# Patient Record
Sex: Female | Born: 1975 | Hispanic: Yes | Marital: Married | Smoking: Never smoker
Health system: Southern US, Community
[De-identification: ages and names within clinical notes are randomized; demographics above are authoritative.]

## PROBLEM LIST (undated history)

## (undated) DIAGNOSIS — I10 Essential (primary) hypertension: Secondary | ICD-10-CM

## (undated) DIAGNOSIS — M1990 Unspecified osteoarthritis, unspecified site: Secondary | ICD-10-CM

## (undated) HISTORY — DX: Essential (primary) hypertension: I10

## (undated) HISTORY — PX: BACK SURGERY: SHX140

## (undated) HISTORY — PX: GALLBLADDER SURGERY: SHX652

## (undated) HISTORY — DX: Unspecified osteoarthritis, unspecified site: M19.90

---

## 2013-09-04 DIAGNOSIS — D259 Leiomyoma of uterus, unspecified: Secondary | ICD-10-CM | POA: Insufficient documentation

## 2013-09-04 DIAGNOSIS — N915 Oligomenorrhea, unspecified: Secondary | ICD-10-CM | POA: Insufficient documentation

## 2013-10-10 DIAGNOSIS — N840 Polyp of corpus uteri: Secondary | ICD-10-CM | POA: Insufficient documentation

## 2013-10-26 DIAGNOSIS — I10 Essential (primary) hypertension: Secondary | ICD-10-CM | POA: Insufficient documentation

## 2014-01-14 DIAGNOSIS — K8020 Calculus of gallbladder without cholecystitis without obstruction: Secondary | ICD-10-CM | POA: Insufficient documentation

## 2014-01-14 DIAGNOSIS — K810 Acute cholecystitis: Secondary | ICD-10-CM | POA: Insufficient documentation

## 2014-01-15 DIAGNOSIS — N83201 Unspecified ovarian cyst, right side: Secondary | ICD-10-CM | POA: Insufficient documentation

## 2014-01-17 DIAGNOSIS — R748 Abnormal levels of other serum enzymes: Secondary | ICD-10-CM | POA: Insufficient documentation

## 2014-04-12 HISTORY — PX: CHOLECYSTECTOMY: SHX55

## 2014-11-08 DIAGNOSIS — E669 Obesity, unspecified: Secondary | ICD-10-CM | POA: Insufficient documentation

## 2015-02-05 DIAGNOSIS — R569 Unspecified convulsions: Secondary | ICD-10-CM | POA: Insufficient documentation

## 2015-02-06 DIAGNOSIS — I455 Other specified heart block: Secondary | ICD-10-CM | POA: Insufficient documentation

## 2015-02-06 DIAGNOSIS — E236 Other disorders of pituitary gland: Secondary | ICD-10-CM | POA: Insufficient documentation

## 2015-04-13 HISTORY — PX: BACK SURGERY: SHX140

## 2016-06-05 DIAGNOSIS — M5126 Other intervertebral disc displacement, lumbar region: Secondary | ICD-10-CM | POA: Insufficient documentation

## 2017-08-03 DIAGNOSIS — M79602 Pain in left arm: Secondary | ICD-10-CM | POA: Insufficient documentation

## 2020-06-03 ENCOUNTER — Other Ambulatory Visit: Payer: Self-pay | Admitting: Nurse Practitioner

## 2020-06-03 DIAGNOSIS — I10 Essential (primary) hypertension: Secondary | ICD-10-CM

## 2020-06-03 DIAGNOSIS — R202 Paresthesia of skin: Secondary | ICD-10-CM

## 2020-06-03 DIAGNOSIS — R519 Headache, unspecified: Secondary | ICD-10-CM

## 2020-06-08 ENCOUNTER — Ambulatory Visit (INDEPENDENT_AMBULATORY_CARE_PROVIDER_SITE_OTHER): Payer: 59

## 2020-06-08 ENCOUNTER — Other Ambulatory Visit: Payer: Self-pay

## 2020-06-08 DIAGNOSIS — R519 Headache, unspecified: Secondary | ICD-10-CM | POA: Diagnosis not present

## 2020-06-08 DIAGNOSIS — R202 Paresthesia of skin: Secondary | ICD-10-CM

## 2020-06-08 DIAGNOSIS — I10 Essential (primary) hypertension: Secondary | ICD-10-CM | POA: Diagnosis not present

## 2020-06-19 NOTE — Progress Notes (Addendum)
NEUROLOGY CONSULTATION NOTE  Natasha Figueroa MRN: [id number] DOB: 10-04-1975  Referring provider: Glean Salvo, FNP Primary care provider: Glean Salvo, FNP  Reason for consult:  Headache, facial tingling  Assessment/Plan:   1.  Recurrent transient spells - unclear etiology.  Symptoms due not correspond to the stroke.  Consider migraine vs partial seizure 2.  Punctate left basal ganglia infarct - incidental finding - likely due to small vessel disease secondary to hypertension 3.  Hypertension  1.  Stroke workup: - CTA head and neck - lipid panel - echo 2.  Check routine EEG.  If unremarkable, will check 48 hour ambulatory EEG (will likely capture habitual spell as they occur daily) 3.  Recommend starting ASA 81mg  daily.  Husband reports prior reaction to ASA - "it made her crazy".  Her chart states tachycardia.  However it was for higher doses.  She will try 81mg  daily. 4.  Follow up with PCP to optomize blood pressure control 5.  Follow up after testing.   Subjective:  Natasha Figueroa is a 45 year old right-handed female with HTN and pituitary tumor who presents for headache and facial tingling.  History supplemented by primary care note.  She is also accompanied by her husband who provides history.  She started having headaches around 2016.  MRI of brain at that time showed a pituitary tumor.  She was going to undergo surgery but it was canceled because the mass shrank.  Headaches subsequently improved.   In late January, she began experiencing recurrent episodes.  She describes sensation of ants crawling on the right side of her face.  She feels like she is moving in slow motion.  She also has a metallic taste in her mouth.  Sometimes she loses complete vision for a couple of seconds.  No associated headache, dizziness, nausea, photophobia or unilateral numbness or weakness of her body.  They would last 30 minutes and occur every 3 to 5 hours.  She has an MRI of the brain without  contrast on 06/09/2020 which was personally reviewed and showed a punctate acute to subacute infarct in the left basal ganglia.  Minimal nonspecific cerebral white matter foci and empty sella also noted.  She has longstanding history of hypertension which has been difficult to control.    PAST MEDICAL HISTORY: Hypertension  PAST SURGICAL HISTORY: Past Surgical History:  Procedure Laterality Date  . BACK SURGERY    . GALLBLADDER SURGERY     MEDICATIONS: No current outpatient medications on file prior to visit.   No current facility-administered medications on file prior to visit.    ALLERGIES: Allergies  Allergen Reactions  . Aspirin Other (See Comments) and Palpitations    tachycardia tachycardia   . Tramadol Other (See Comments) and Rash    Rash all over body. Rash all over body.    FAMILY HISTORY: History reviewed. No pertinent family history.  Objective:  Blood pressure (!) 176/93, pulse 61, height 5\' 8"  (1.727 m), weight 213 lb 12.8 oz (97 kg), SpO2 100 %. General: No acute distress.  Patient appears well-groomed.   Head:  Normocephalic/atraumatic Eyes:  fundi examined but not visualized Neck: supple, no paraspinal tenderness, full range of motion Back: No paraspinal tenderness Heart: regular rate and rhythm Lungs: Clear to auscultation bilaterally. Vascular: No carotid bruits. Neurological Exam: Mental status: alert and oriented to person, place, and time, recent and remote memory intact, fund of knowledge intact, attention and concentration intact, speech fluent and not dysarthric, language intact. Cranial nerves:  CN I: not tested CN II: pupils equal, round and reactive to light, visual fields intact CN III, IV, VI:  full range of motion, no nystagmus, no ptosis CN V: facial sensation intact. CN VII: upper and lower face symmetric CN VIII: hearing intact CN IX, X: gag intact, uvula midline CN XI: sternocleidomastoid and trapezius muscles intact CN XII:  tongue midline Bulk & Tone: normal, no fasciculations. Motor:  muscle strength 5/5 throughout Sensation:  Pinprick, temperature and vibratory sensation intact. Deep Tendon Reflexes:  2+ throughout,  toes downgoing.   Finger to nose testing:  Without dysmetria.   Heel to shin:  Without dysmetria.   Gait:  Normal station and stride.  Romberg negative.    Thank you for allowing me to take part in the care of this patient.  Metta Clines, DO

## 2020-06-20 ENCOUNTER — Other Ambulatory Visit (INDEPENDENT_AMBULATORY_CARE_PROVIDER_SITE_OTHER): Payer: 59

## 2020-06-20 ENCOUNTER — Ambulatory Visit: Payer: 59 | Admitting: Neurology

## 2020-06-20 ENCOUNTER — Other Ambulatory Visit: Payer: Self-pay

## 2020-06-20 ENCOUNTER — Encounter: Payer: Self-pay | Admitting: Neurology

## 2020-06-20 VITALS — BP 176/93 | HR 61 | Ht 68.0 in | Wt 213.8 lb

## 2020-06-20 DIAGNOSIS — I10 Essential (primary) hypertension: Secondary | ICD-10-CM

## 2020-06-20 DIAGNOSIS — I6381 Other cerebral infarction due to occlusion or stenosis of small artery: Secondary | ICD-10-CM

## 2020-06-20 DIAGNOSIS — R29818 Other symptoms and signs involving the nervous system: Secondary | ICD-10-CM

## 2020-06-20 LAB — LIPID PANEL
Cholesterol: 200 mg/dL (ref 0–200)
HDL: 69.3 mg/dL (ref >39.00)
LDL Cholesterol: 112 mg/dL — ABNORMAL HIGH (ref 0–99)
NonHDL: 130.86
Total CHOL/HDL Ratio: 3
Triglycerides: 93 mg/dL (ref 0.0–149.0)
VLDL: 18.6 mg/dL (ref 0.0–40.0)

## 2020-06-20 NOTE — Patient Instructions (Addendum)
1.  Start aspirin 81mg  daily 2.  Check routine EEG.  If unremarkable will check 48 hour ambulatory EEG 3.  Check CTA of head and neck 4.  Check lipid panel 5.  Echocardiogram 6.  Follow up after testing.

## 2020-06-23 ENCOUNTER — Other Ambulatory Visit: Payer: Self-pay

## 2020-06-23 ENCOUNTER — Ambulatory Visit (INDEPENDENT_AMBULATORY_CARE_PROVIDER_SITE_OTHER): Payer: 59 | Admitting: Obstetrics & Gynecology

## 2020-06-23 ENCOUNTER — Other Ambulatory Visit (HOSPITAL_COMMUNITY)
Admission: RE | Admit: 2020-06-23 | Discharge: 2020-06-23 | Disposition: A | Discharge status: Home / Self Care | Payer: 59 | Source: Ambulatory Visit | Attending: Obstetrics & Gynecology | Admitting: Obstetrics & Gynecology

## 2020-06-23 ENCOUNTER — Telehealth: Payer: Self-pay | Admitting: Neurology

## 2020-06-23 ENCOUNTER — Encounter: Payer: Self-pay | Admitting: Obstetrics & Gynecology

## 2020-06-23 VITALS — BP 135/90 | HR 55 | Ht 68.0 in | Wt 215.0 lb

## 2020-06-23 DIAGNOSIS — Z1231 Encounter for screening mammogram for malignant neoplasm of breast: Secondary | ICD-10-CM | POA: Diagnosis not present

## 2020-06-23 DIAGNOSIS — Z01419 Encounter for gynecological examination (general) (routine) without abnormal findings: Secondary | ICD-10-CM | POA: Insufficient documentation

## 2020-06-23 DIAGNOSIS — E230 Hypopituitarism: Secondary | ICD-10-CM

## 2020-06-23 NOTE — Progress Notes (Signed)
Subjective:     Natasha Figueroa is a 45 y.o. female here for a routine exam. LMP 2018 Current complaints: Pt is amenorrheic. She was dx'd with a pituitary adenoma in 2018 after noting galactorrhea and AUB. Since that dx'd she has not had menses and the galactorrhea resolved. She is followed by neurology. She would like to have another baby but is not sure since she is amenorrheic. Her last MRI was 05/2020  Gynecologic History No LMP recorded. (Menstrual status: Other). Contraception: none Last Pap: >5 years prev. Results were: normal Last mammogram: age 55 years.   Obstetric History OB History  Gravida Para Term Preterm AB Living  3 3 3     3   SAB IAB Ectopic Multiple Live Births          3    # Outcome Date GA Lbr Len/2nd Weight Sex Delivery Anes PTL Lv  3 Term 2001 [redacted]w[redacted]d   F Vag-Spont None N LIV  2 Term 1999 [redacted]w[redacted]d   F Vag-Spont None N LIV  1 Term 100 [redacted]w[redacted]d   F Vag-Spont None N LIV   The following portions of the patient's history were reviewed and updated as appropriate: allergies, current medications, past family history, past medical history, past social history, past surgical history and problem list.  Review of Systems Pertinent items are noted in HPI.    Objective:  BP 135/90   Pulse (!) 55   Ht 5\' 8"  (1.727 m)   Wt 215 lb (97.5 kg)   BMI 32.69 kg/m  General Appearance:    Alert, cooperative, no distress, appears stated age  Head:    Normocephalic, without obvious abnormality, atraumatic  Eyes:    conjunctiva/corneas clear, EOM's intact, both eyes  Ears:    Normal external ear canals, both ears  Nose:   Nares normal, septum midline, mucosa normal, no drainage    or sinus tenderness  Throat:   Lips, mucosa, and tongue normal; teeth and gums normal  Neck:   Supple, symmetrical, trachea midline, no adenopathy;    thyroid:  no enlargement/tenderness/nodules  Back:     Symmetric, no curvature, ROM normal, no CVA tenderness  Lungs:     respirations unlabored  Chest Wall:    No  tenderness or deformity   Heart:    Regular rate and rhythm  Breast Exam:    No tenderness, masses, or nipple abnormality  Abdomen:     Soft, non-tender, bowel sounds active all four quadrants,    no masses, no organomegaly  Genitalia:    Normal female without lesion, discharge or tenderness     Extremities:   Extremities normal, atraumatic, no cyanosis or edema  Pulses:   2+ and symmetric all extremities  Skin:   Skin color, texture, turgor normal, no rashes or lesions      Assessment:    Healthy female exam.   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 Discussed with pt need for referral to REI if she is interested in actively pursuing pregnancy.    Plan:  Shiva was seen today for annual exam.  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 Harraway-Smith, M.D., Cherlynn June

## 2020-06-23 NOTE — Progress Notes (Signed)
Cap interpreter Retta Mac. Alucard Fearnow l Elektra Wartman, CMA

## 2020-06-23 NOTE — Telephone Encounter (Signed)
Patient's spouse called and said they will need the testing orders sent to another facility that is in-network with her insurance. He is not sure where that may be.

## 2020-06-24 ENCOUNTER — Telehealth: Payer: Self-pay | Admitting: General Practice

## 2020-06-24 LAB — CYTOLOGY - PAP
Comment: NEGATIVE
Diagnosis: NEGATIVE
High risk HPV: NEGATIVE

## 2020-06-24 NOTE — Telephone Encounter (Signed)
I have faxed all the PA forms and clinicals for the CTA head and neck @ [facility] and then the Echo Bubble study @ hospital. Pending determination.

## 2020-06-24 NOTE — Telephone Encounter (Signed)
Left message on VM informing patient of Mammogram appt scheduled on 07/08/2020 at 9:20am.

## 2020-07-02 ENCOUNTER — Other Ambulatory Visit: Payer: Self-pay

## 2020-07-02 DIAGNOSIS — R29818 Other symptoms and signs involving the nervous system: Secondary | ICD-10-CM

## 2020-07-02 DIAGNOSIS — I10 Essential (primary) hypertension: Secondary | ICD-10-CM

## 2020-07-02 DIAGNOSIS — I6381 Other cerebral infarction due to occlusion or stenosis of small artery: Secondary | ICD-10-CM

## 2020-07-02 NOTE — Telephone Encounter (Signed)
After calling her insurance company- the patient can only have the CTA of neck and head and the ECHO done at [address]. This is the address for [facility] @ [address] They have imaging there as well as I have already called the office to make sure they can complete all the necessary testing. Please send all 3 orders to this location as it is the only [facility] location where patient can have it done through insurance.   I have already received the approval for the CTAs Auth #: [id number] valid until 09/27/20. They are still working on the ECHO approval. But I will update this once we receive it.

## 2020-07-07 ENCOUNTER — Other Ambulatory Visit: Payer: 59

## 2020-07-08 ENCOUNTER — Other Ambulatory Visit: Payer: Self-pay

## 2020-07-08 ENCOUNTER — Ambulatory Visit (HOSPITAL_BASED_OUTPATIENT_CLINIC_OR_DEPARTMENT_OTHER)
Admission: RE | Admit: 2020-07-08 | Discharge: 2020-07-08 | Disposition: A | Discharge status: Home / Self Care | Payer: 59 | Source: Ambulatory Visit | Attending: Obstetrics & Gynecology | Admitting: Obstetrics & Gynecology

## 2020-07-08 ENCOUNTER — Telehealth: Payer: Self-pay | Admitting: Neurology

## 2020-07-08 ENCOUNTER — Encounter (HOSPITAL_BASED_OUTPATIENT_CLINIC_OR_DEPARTMENT_OTHER): Payer: Self-pay

## 2020-07-08 DIAGNOSIS — Z1231 Encounter for screening mammogram for malignant neoplasm of breast: Secondary | ICD-10-CM | POA: Diagnosis present

## 2020-07-08 DIAGNOSIS — I6381 Other cerebral infarction due to occlusion or stenosis of small artery: Secondary | ICD-10-CM

## 2020-07-08 NOTE — Telephone Encounter (Signed)
Patient called and she has questions about a referral to a cardiologist.

## 2020-07-09 NOTE — Telephone Encounter (Signed)
Ruth at [facility] [place],  [place]  [address] Get Driving Directions Main: [phone]  Info given to pt. Pt advised office will give her call. If they are not in her insurance network then the pt needs to et [place] know who is in network.   Per pt insurance company sent her a couple of places she could go to.    [facility].

## 2020-07-10 NOTE — Telephone Encounter (Signed)
The Echo does not Require PA (REF#: [id number])   I might would call the cardiology office to schedule the CT scans because they have not been scheduled and I'm not sure that the schedule from the referrals in the system. They have been routed to them but nothing has been scheduled. Just FYI.  They should now be able to scheduled everything. Auth #s for CT scans are in the referral and then no auth req for the Echo.   Thanks!

## 2020-07-10 NOTE — Telephone Encounter (Signed)
Message sent to cardiology

## 2020-07-16 ENCOUNTER — Other Ambulatory Visit: Payer: Self-pay

## 2020-07-16 ENCOUNTER — Ambulatory Visit (INDEPENDENT_AMBULATORY_CARE_PROVIDER_SITE_OTHER): Payer: 59 | Admitting: Neurology

## 2020-07-16 DIAGNOSIS — I6381 Other cerebral infarction due to occlusion or stenosis of small artery: Secondary | ICD-10-CM

## 2020-07-16 DIAGNOSIS — R29818 Other symptoms and signs involving the nervous system: Secondary | ICD-10-CM

## 2020-07-16 DIAGNOSIS — I10 Essential (primary) hypertension: Secondary | ICD-10-CM

## 2020-07-17 NOTE — Procedures (Signed)
ELECTROENCEPHALOGRAM REPORT  Date of Study: 07/16/2020  Patient's Name: Natasha Figueroa MRN: [id number] Date of Birth: 1975-09-17  Clinical History: 45 year old female who presents for recurrent episodes of right sided facial tingling and altered sensorium  Medications: No medications  Technical Summary: A multichannel digital EEG recording measured by the international 10-20 system with electrodes applied with paste and impedances below 5000 ohms performed in our laboratory with EKG monitoring in an awake and asleep patient.  Photic stimulation was performed.  The digital EEG was referentially recorded, reformatted, and digitally filtered in a variety of bipolar and referential montages for optimal display.    Description: The patient is awake and asleep during the recording.  During maximal wakefulness, there is a symmetric, medium voltage 10 Hz posterior dominant rhythm that attenuates with eye opening.  The record is symmetric.  During drowsiness and sleep, there is an increase in theta slowing of the background.  Vertex waves and symmetric sleep spindles were seen.  Hyperventilation and photic stimulation did not elicit any abnormalities.  There were no epileptiform discharges or electrographic seizures seen.    EKG lead was unremarkable.  Impression: This awake and asleep EEG is normal.    Clinical Correlation: A normal EEG does not exclude a clinical diagnosis of epilepsy.  If further clinical questions remain, prolonged EEG may be helpful.  Clinical correlation is advised.   Metta Clines, DO

## 2020-07-18 ENCOUNTER — Encounter (HOSPITAL_COMMUNITY): Payer: Self-pay | Admitting: Neurology

## 2020-07-31 ENCOUNTER — Telehealth (HOSPITAL_COMMUNITY): Payer: Self-pay | Admitting: Neurology

## 2020-07-31 NOTE — Telephone Encounter (Signed)
Just an FYI. We have made several attempts to contact this patient including sending a letter to schedule or reschedule their echocardiogram. We will be removing the patient from the echo [place].  07/18/20 MAILED LETTER [facility]  07/18/20 [facility] to schedule @ 10:30/[facility]  07/15/20 [facility] to schedule @ 9:04/[facility]  07/14/20 [facility] to schedule @ 9:54/[facility]     Thank you

## 2020-08-04 ENCOUNTER — Other Ambulatory Visit: Payer: Self-pay

## 2020-08-04 ENCOUNTER — Ambulatory Visit (INDEPENDENT_AMBULATORY_CARE_PROVIDER_SITE_OTHER): Payer: 59 | Admitting: Neurology

## 2020-08-04 ENCOUNTER — Other Ambulatory Visit: Payer: 59

## 2020-08-04 DIAGNOSIS — R29818 Other symptoms and signs involving the nervous system: Secondary | ICD-10-CM

## 2020-08-04 DIAGNOSIS — I6381 Other cerebral infarction due to occlusion or stenosis of small artery: Secondary | ICD-10-CM

## 2020-08-04 DIAGNOSIS — I10 Essential (primary) hypertension: Secondary | ICD-10-CM

## 2020-08-08 ENCOUNTER — Telehealth: Payer: Self-pay | Admitting: Neurology

## 2020-08-08 MED ORDER — LEVETIRACETAM 500 MG PO TABS: 500.0000 mg | ORAL_TABLET | Freq: Two times a day (BID) | ORAL | 3 refills | Status: DC

## 2020-08-08 NOTE — Progress Notes (Signed)
ELECTROENCEPHALOGRAM REPORT  Dates of Recording: 08/04/2020 at 08:56 to 08/06/2020 at 09:25  Patient's Name: Natasha Figueroa MRN: [id number] Date of Birth: 04/07/1976  Procedure: 48-hour ambulatory EEG  History: 45 year old female with episodes of headache, facial tingling and metallic taste in her mouth  Medications: None  Technical Summary: This is a 48-hour multichannel digital EEG recording measured by the international 10-20 system with electrodes applied with paste and impedances below 5000 ohms performed as portable with EKG monitoring.  The digital EEG was referentially recorded, reformatted, and digitally filtered in a variety of bipolar and referential montages for optimal display.    DESCRIPTION OF RECORDING: During maximal wakefulness, the background activity consisted of a symmetric 11Hz  posterior dominant rhythm which was reactive to eye opening.  There were no epileptiform discharges or focal slowing seen in wakefulness.  During the recording, the patient progresses through wakefulness, drowsiness, and Stage 2 sleep.  Again, there were no epileptiform discharges seen.  Events:  08/05/2020 at 12:37:  Patient reports sensation of metal in her mouth.  On video, patient sitting on couch with no abnormal movements or altered consciousness.    08/05/2020 at 16:44:  Patient reports tingling on her head.  On video, patient sitting on couch with no abnormal movements or altered consciousness.    There were no electrographic seizures seen.  EKG lead was unremarkable.  IMPRESSION: This 48-hour ambulatory EEG study is normal.    CLINICAL CORRELATION: A normal EEG does not exclude a clinical diagnosis of epilepsy.  If further clinical questions remain, inpatient video EEG monitoring may be helpful.   Metta Clines, DO

## 2020-08-08 NOTE — Telephone Encounter (Signed)
Ambulatory EEG is normal.  However, that doesn't necessarily rule out seizure.  I would like to start a seizure medication to see if these episodes (facial numbness, metallic taste) resolve.  I would like to start levetiracetam 500mg  twice daily.     Pt advised, script sent to the pharmacy.

## 2020-08-08 NOTE — Telephone Encounter (Signed)
New message    Returning call back to the nurse.  

## 2020-08-08 NOTE — Telephone Encounter (Signed)
Tried calling pt, No answer. LMOVM 

## 2020-08-08 NOTE — Telephone Encounter (Signed)
Ambulatory EEG is normal.  However, that doesn't necessarily rule out seizure.  I would like to start a seizure medication to see if these episodes (facial numbness, metallic taste) resolve.  I would like to start levetiracetam 500mg  twice daily.

## 2020-11-04 ENCOUNTER — Other Ambulatory Visit: Payer: Self-pay

## 2020-11-04 DIAGNOSIS — I6381 Other cerebral infarction due to occlusion or stenosis of small artery: Secondary | ICD-10-CM

## 2020-11-04 DIAGNOSIS — I10 Essential (primary) hypertension: Secondary | ICD-10-CM

## 2020-11-04 DIAGNOSIS — R29818 Other symptoms and signs involving the nervous system: Secondary | ICD-10-CM

## 2020-11-26 ENCOUNTER — Telehealth: Payer: Self-pay | Admitting: Neurology

## 2020-11-26 NOTE — Telephone Encounter (Signed)
Per Pt husband pt did not need another Mri, Pt had CTA in March.   Given results and referred to Cardiology.    Please advise.

## 2021-01-27 NOTE — Progress Notes (Deleted)
   NEUROLOGY FOLLOW UP OFFICE NOTE  [name] [id number]  Assessment/Plan:   1.  Recurrent transient spells - unclear etiology.  Symptoms due not correspond to the stroke.  Consider migraine vs partial seizure 2.  Punctate left basal ganglia infarct - incidental finding - likely due to small vessel disease secondary to hypertension 3.  Hypertension  Subjective:  Natasha Figueroa is a 45 year old right-handed female with HTN and pituitary tumor who presents for headache and facial tingling.  History supplemented by primary care note.  She is also accompanied by her husband who provides history.  UPDATE: Current medications:  ***  CTA of head and neck and echocardiogram were ordered but not performed.  ***.  Routine awake and asleep EEG on 07/16/2020 was normal.  48 hour ambulatory EEG was ordered but not performed.  ***   HISTORY: She started having headaches around 2016.  MRI of brain at that time showed a pituitary tumor.  She was going to undergo surgery but it was canceled because the mass shrank.  Headaches subsequently improved.    In late January 2022, she began experiencing recurrent episodes.  She describes sensation of ants crawling on the right side of her face.  She feels like she is moving in slow motion.  She also has a metallic taste in her mouth.  Sometimes she loses complete vision for a couple of seconds.  No associated headache, dizziness, nausea, photophobia or unilateral numbness or weakness of her body.  They would last 30 minutes and occur every 3 to 5 hours.  She has an MRI of the brain without contrast on 06/09/2020 which was personally reviewed and showed a punctate acute to subacute infarct in the left basal ganglia.  Minimal nonspecific cerebral white matter foci and empty sella also noted.  She has longstanding history of hypertension which has been difficult to control.   PAST MEDICAL HISTORY: Past Medical History:  Diagnosis Date   Hypertension      MEDICATIONS: Current Outpatient Medications on File Prior to Visit  Medication Sig Dispense Refill   atenolol-chlorthalidone (TENORETIC) 100-25 MG tablet Take 1 tablet by mouth daily.     levETIRAcetam (KEPPRA) 500 MG tablet Take 1 tablet (500 mg total) by mouth 2 (two) times daily. 60 tablet 3   No current facility-administered medications on file prior to visit.    ALLERGIES: Allergies  Allergen Reactions   Aspirin Other (See Comments) and Palpitations    tachycardia tachycardia    Tramadol Other (See Comments) and Rash    Rash all over body. Rash all over body.     FAMILY HISTORY: Family History  Problem Relation Age of Onset   Diabetes Father    Asthma Mother    Hypertension Mother       Objective:  *** General: No acute distress.  Patient appears well-groomed.   Head:  Normocephalic/atraumatic Eyes:  Fundi examined but not visualized Neck: supple, no paraspinal tenderness, full range of motion Heart:  Regular rate and rhythm Lungs:  Clear to auscultation bilaterally Back: No paraspinal tenderness Neurological Exam: alert and oriented to person, place, and time.  Speech fluent and not dysarthric, language intact.  CN II-XII intact. Bulk and tone normal, muscle strength 5/5 throughout.  Sensation to light touch intact.  Deep tendon reflexes 2+ throughout, toes downgoing.  Finger to nose testing intact.  Gait normal, Romberg negative.   Metta Clines, DO

## 2021-01-28 ENCOUNTER — Ambulatory Visit: Payer: 59 | Admitting: Neurology

## 2021-02-12 ENCOUNTER — Other Ambulatory Visit: Payer: Self-pay | Admitting: *Deleted

## 2021-02-12 DIAGNOSIS — I83893 Varicose veins of bilateral lower extremities with other complications: Secondary | ICD-10-CM

## 2021-02-19 ENCOUNTER — Other Ambulatory Visit: Payer: Self-pay

## 2021-02-19 ENCOUNTER — Emergency Department (HOSPITAL_COMMUNITY)
Admission: EM | Admit: 2021-02-19 | Discharge: 2021-02-19 | Disposition: A | Discharge status: Home / Self Care | Payer: 59 | Attending: Emergency Medicine | Admitting: Emergency Medicine

## 2021-02-19 ENCOUNTER — Emergency Department (HOSPITAL_COMMUNITY): Payer: 59

## 2021-02-19 ENCOUNTER — Encounter (HOSPITAL_COMMUNITY): Payer: Self-pay

## 2021-02-19 DIAGNOSIS — N9489 Other specified conditions associated with female genital organs and menstrual cycle: Secondary | ICD-10-CM | POA: Diagnosis not present

## 2021-02-19 DIAGNOSIS — Z79899 Other long term (current) drug therapy: Secondary | ICD-10-CM | POA: Diagnosis not present

## 2021-02-19 DIAGNOSIS — J209 Acute bronchitis, unspecified: Secondary | ICD-10-CM | POA: Insufficient documentation

## 2021-02-19 DIAGNOSIS — Z20822 Contact with and (suspected) exposure to covid-19: Secondary | ICD-10-CM | POA: Diagnosis not present

## 2021-02-19 DIAGNOSIS — R079 Chest pain, unspecified: Secondary | ICD-10-CM | POA: Diagnosis present

## 2021-02-19 DIAGNOSIS — I10 Essential (primary) hypertension: Secondary | ICD-10-CM | POA: Insufficient documentation

## 2021-02-19 LAB — CBC
HCT: 40.3 % (ref 36.0–46.0)
Hemoglobin: 13.8 g/dL (ref 12.0–15.0)
MCH: 30.1 pg (ref 26.0–34.0)
MCHC: 34.2 g/dL (ref 30.0–36.0)
MCV: 87.8 fL (ref 80.0–100.0)
Platelets: 229 10*3/uL (ref 150–400)
RBC: 4.59 MIL/uL (ref 3.87–5.11)
RDW: 12.8 % (ref 11.5–15.5)
WBC: 8.6 10*3/uL (ref 4.0–10.5)
nRBC: 0 % (ref 0.0–0.2)

## 2021-02-19 LAB — RESP PANEL BY RT-PCR (FLU A&B, COVID) ARPGX2
Influenza A by PCR: NEGATIVE
Influenza B by PCR: NEGATIVE
SARS Coronavirus 2 by RT PCR: NEGATIVE

## 2021-02-19 LAB — I-STAT BETA HCG BLOOD, ED (MC, WL, AP ONLY): I-stat hCG, quantitative: <5 m[IU]/mL (ref <5)

## 2021-02-19 LAB — BASIC METABOLIC PANEL
Anion gap: 6 (ref 5–15)
BUN: 14 mg/dL (ref 6–20)
CO2: 34 mmol/L — ABNORMAL HIGH (ref 22–32)
Calcium: 9 mg/dL (ref 8.9–10.3)
Chloride: 99 mmol/L (ref 98–111)
Creatinine, Ser: 0.59 mg/dL (ref 0.44–1.00)
GFR, Estimated: >60 mL/min (ref >60)
Glucose, Bld: 134 mg/dL — ABNORMAL HIGH (ref 70–99)
Potassium: 3.5 mmol/L (ref 3.5–5.1)
Sodium: 139 mmol/L (ref 135–145)

## 2021-02-19 LAB — TROPONIN I (HIGH SENSITIVITY)
Troponin I (High Sensitivity): 5 ng/L (ref <18)
Troponin I (High Sensitivity): 4 ng/L (ref <18)

## 2021-02-19 LAB — D-DIMER, QUANTITATIVE: D-Dimer, Quant: 0.41 ug/mL-FEU (ref 0.00–0.50)

## 2021-02-19 MED ORDER — ALBUTEROL SULFATE HFA 108 (90 BASE) MCG/ACT IN AERS
4.0000 | INHALATION_SPRAY | Freq: Once | RESPIRATORY_TRACT | Status: AC
  Administered 2021-02-19: 4 via RESPIRATORY_TRACT
  Filled 2021-02-19: qty 6.7

## 2021-02-19 NOTE — ED Triage Notes (Addendum)
Pt reports shortness of breath, cough and sternal chest pains that began yesterday but became unbearable around 0100. Pain worsened after eating.

## 2021-02-19 NOTE — Discharge Instructions (Signed)
You may use the albuterol inhaler 2 puffs every 4 hours as needed for cough, shortness of breath, wheezing.  If you develop fever, new or worsening shortness of breath, coughing up blood, chest pain, or any other new/concerning symptoms then return to the ER for evaluation.  Puede usar Forensic psychologist de albuterol 2 inhalaciones cada 4 horas segn sea necesario para la tos, dificultad para respirar, sibilancias.  Si presenta fiebre, dificultad para respirar nueva o que [place], tos con [place], dolor en el pecho o cualquier otro sntoma nuevo o preocupante, regrese a la sala de emergencias para una evaluacin.

## 2021-02-19 NOTE — ED Provider Notes (Signed)
[facility] DEPT Provider Note   CSN: [id number] Arrival date & time: 02/19/21  7616     History Chief Complaint  Patient presents with   Chest Pain    Natasha Figueroa is a 45 y.o. female.  HPI 45 year old female presents with chest pain and shortness of breath.  Started around 1 AM.  Previous to that she was feeling fine.  Her chest feels like a squeezing sensation in the middle.  She is also feeling short of breath with intermittent coughing.  She think she might of heard some wheezing.  No leg swelling.  No fevers.  She has not tried anything specific for this.  At one point started coughing so bad she thought she might throw up.  Past Medical History:  Diagnosis Date   Hypertension     There are no problems to display for this patient.   Past Surgical History:  Procedure Laterality Date   BACK SURGERY     GALLBLADDER SURGERY       OB History     Gravida  3   Para  3   Term  3   Preterm      AB      Living  3      SAB      IAB      Ectopic      Multiple      Live Births  3           Family History  Problem Relation Age of Onset   Diabetes Father    Asthma Mother    Hypertension Mother     Social History   Tobacco Use   Smoking status: Never   Smokeless tobacco: Never  Vaping Use   Vaping Use: Never used  Substance Use Topics   Alcohol use: Never   Drug use: Never    Home Medications Prior to Admission medications   Medication Sig Start Date End Date Taking? Authorizing Provider  atenolol-chlorthalidone (TENORETIC) 100-25 MG tablet Take 1 tablet by mouth daily. 12/30/19  Yes [provider]  levETIRAcetam (KEPPRA) 500 MG tablet Take 1 tablet (500 mg total) by mouth 2 (two) times daily. Patient not taking: Reported on 02/19/2021 08/08/20   Pieter Partridge, DO    Allergies    Aspirin and Tramadol  Review of Systems   Review of Systems  Constitutional:  Negative for fever.  Respiratory:   Positive for cough and shortness of breath.   Cardiovascular:  Positive for chest pain. Negative for leg swelling.  Gastrointestinal:  Negative for abdominal pain.  Musculoskeletal:  Negative for back pain.  All other systems reviewed and are negative.  Physical Exam Updated Vital Signs BP (!) 142/101   Pulse 79   Temp 97.6 F (36.4 C) (Oral)   Resp 18   SpO2 100%   Physical Exam Vitals and nursing note reviewed.  Constitutional:      General: She is not in acute distress.    Appearance: She is well-developed. She is not ill-appearing or diaphoretic.  HENT:     Head: Normocephalic and atraumatic.     Right Ear: External ear normal.     Left Ear: External ear normal.     Nose: Nose normal.  Eyes:     General:        Right eye: No discharge.        Left eye: No discharge.  Cardiovascular:     Rate and Rhythm: Normal rate  and regular rhythm.     Heart sounds: Normal heart sounds.  Pulmonary:     Effort: Pulmonary effort is normal.     Breath sounds: Normal breath sounds.     Comments: Perhaps minimal wheezing heard, quite faint Abdominal:     Palpations: Abdomen is soft.     Tenderness: There is no abdominal tenderness.  Skin:    General: Skin is warm and dry.  Neurological:     Mental Status: She is alert.  Psychiatric:        Mood and Affect: Mood is not anxious.    ED Results / Procedures / Treatments   Labs (all labs ordered are listed, but only abnormal results are displayed) Labs Reviewed  BASIC METABOLIC PANEL - Abnormal; Notable for the following components:      Result Value   CO2 34 (*)    Glucose, Bld 134 (*)    All other components within normal limits  RESP PANEL BY RT-PCR (FLU A&B, COVID) ARPGX2  CBC  D-DIMER, QUANTITATIVE  I-STAT BETA HCG BLOOD, ED ([facility], [facility], [facility] ONLY)  TROPONIN I (HIGH SENSITIVITY)  TROPONIN I (HIGH SENSITIVITY)    EKG EKG Interpretation  Date/Time:  Thursday February 19 2021 06:32:03 EST Ventricular Rate:  98 PR  Interval:  175 QRS Duration: 99 QT Interval:  360 QTC Calculation: 460 R Axis:   69 Text Interpretation: Sinus rhythm Consider right atrial enlargement no acute ST/T changes No old tracing to compare Confirmed by Sherwood Gambler [phone]) on 02/19/2021 7:03:24 AM  Radiology DG Chest 2 View  Result Date: 02/19/2021 CLINICAL DATA:  Chest pain and shortness of breath EXAM: CHEST - 2 VIEW COMPARISON:  07/22/2020 FINDINGS: Normal heart size and mediastinal contours. No acute infiltrate or edema. No effusion or pneumothorax. No acute osseous findings. IMPRESSION: Negative chest. Electronically Signed   By: Jorje Guild M.D.   On: 02/19/2021 07:10    Procedures Procedures   Medications Ordered in ED Medications  albuterol (VENTOLIN HFA) 108 (90 Base) MCG/ACT inhaler 4 puff (4 puffs Inhalation Given 02/19/21 0859)    ED Course  I have reviewed the triage vital signs and the nursing notes.  Pertinent labs & imaging results that were available during my care of the patient were reviewed by me and considered in my medical decision making (see chart for details).    MDM Rules/Calculators/A&P                           Patient most likely has viral URI/bronchitis.  There was some very slight wheezing which seems to be improved clinically with some albuterol.  She is feeling much better.  Chest x-ray as well as labs and ECG are all unremarkable including 2 negative troponins and negative D-dimer.  Low suspicion for ACS, PE, dissection, bacterial pneumonia.  She does not have any known lung disease such as asthma.  We will give her the albuterol inhaler to use as needed at home but otherwise discussed this is a supportive care issue.  Given return precautions. Final Clinical Impression(s) / ED Diagnoses Final diagnoses:  Acute bronchitis, unspecified organism    Rx / DC Orders ED Discharge Orders     None        Sherwood Gambler, MD 02/19/21 1058

## 2021-02-23 ENCOUNTER — Encounter (HOSPITAL_COMMUNITY): Payer: Self-pay

## 2021-02-23 ENCOUNTER — Inpatient Hospital Stay (HOSPITAL_COMMUNITY): Admission: RE | Admit: 2021-02-23 | Payer: 59 | Source: Ambulatory Visit

## 2021-03-26 ENCOUNTER — Ambulatory Visit (HOSPITAL_COMMUNITY)
Admission: RE | Admit: 2021-03-26 | Discharge: 2021-03-26 | Disposition: A | Discharge status: Home / Self Care | Payer: 59 | Source: Ambulatory Visit | Attending: Physician Assistant | Admitting: Physician Assistant

## 2021-03-26 ENCOUNTER — Other Ambulatory Visit: Payer: Self-pay

## 2021-03-26 ENCOUNTER — Ambulatory Visit (INDEPENDENT_AMBULATORY_CARE_PROVIDER_SITE_OTHER): Payer: 59 | Admitting: Physician Assistant

## 2021-03-26 VITALS — BP 120/80 | HR 67 | Temp 97.8°F | Resp 20 | Ht 68.0 in | Wt 222.6 lb

## 2021-03-26 DIAGNOSIS — G43909 Migraine, unspecified, not intractable, without status migrainosus: Secondary | ICD-10-CM | POA: Insufficient documentation

## 2021-03-26 DIAGNOSIS — I83893 Varicose veins of bilateral lower extremities with other complications: Secondary | ICD-10-CM

## 2021-03-26 DIAGNOSIS — I8393 Asymptomatic varicose veins of bilateral lower extremities: Secondary | ICD-10-CM

## 2021-03-26 NOTE — Progress Notes (Signed)
[facility]   Reason for referral: multiple varicose, spider veins B legs  History of Present Illness  Natasha Figueroa is a 45 y.o. female who presents with chief complaint: swollen leg.  Patient notes, onset of notable veins was > 5 years ago months ago, associated with family history.  The patient has had no history of DVT, positive history of varicose vein, no history of venous stasis ulcers, no history of  Lymphedema and no history of skin changes in lower legs.  There is positive family history of venous disorders.  The patient has not used compression stockings in the past.  Past medical history includes HTN.  Past Medical History:  Diagnosis Date   Hypertension     Past Surgical History:  Procedure Laterality Date   BACK SURGERY     GALLBLADDER SURGERY      Social History   Socioeconomic History   Marital status: Married    Spouse name: Not on file   Number of children: Not on file   Years of education: Not on file   Highest education level: Not on file  Occupational History   Not on file  Tobacco Use   Smoking status: Never   Smokeless tobacco: Never  Vaping Use   Vaping Use: Never used  Substance and Sexual Activity   Alcohol use: Never   Drug use: Never   Sexual activity: Yes    Birth control/protection: None  Other Topics Concern   Not on file  Social History Narrative   Right handed    Lives with family    Social Determinants of Health   Financial Resource Strain: Not on file  Food Insecurity: Not on file  Transportation Needs: Not on file  Physical Activity: Not on file  Stress: Not on file  Social Connections: Not on file  Intimate Partner Violence: Not on file    Family History  Problem Relation Age of Onset   Diabetes Father    Asthma Mother    Hypertension Mother     Current Outpatient Medications on File Prior to Visit  Medication Sig Dispense Refill   atenolol-chlorthalidone (TENORETIC) 100-25 MG tablet  Take 1 tablet by mouth daily.     No current facility-administered medications on file prior to visit.    Allergies as of 03/26/2021 - Review Complete 03/26/2021  Allergen Reaction Noted   Aspirin Other (See Comments) and Palpitations 10/26/2009   Tramadol Other (See Comments) and Rash 03/13/2015     ROS:   General:  No weight loss, Fever, chills  HEENT: No recent headaches, no nasal bleeding, no visual changes, no sore throat  Neurologic: No dizziness, blackouts, seizures. No recent symptoms of stroke or mini- stroke. No recent episodes of slurred speech, or temporary blindness.  Cardiac: No recent episodes of chest pain/pressure, no shortness of breath at rest.  No shortness of breath with exertion.  Denies history of atrial fibrillation or irregular heartbeat  Vascular: No history of rest pain in feet.  No history of claudication.  No history of non-healing ulcer, No history of DVT   Pulmonary: No home oxygen, no productive cough, no hemoptysis,  No asthma or wheezing  Musculoskeletal:  [ ]  Arthritis, [ ]  Low back pain,  [ ]  Joint pain  Hematologic:No history of hypercoagulable state.  No history of easy bleeding.  No history of anemia  Gastrointestinal: No hematochezia or melena,  No gastroesophageal reflux, no trouble swallowing  Urinary: [ ]  chronic Kidney disease, [ ]   on HD - [ ]  MWF or [ ]  TTHS, [ ]  Burning with urination, [ ]  Frequent urination, [ ]  Difficulty urinating;   Skin: No rashes  Psychological: No history of anxiety,  No history of depression  Physical Examination  Vitals:   03/26/21 1345  BP: 120/80  Pulse: 67  Resp: 20  Temp: 97.8 F (36.6 C)  TempSrc: Temporal  SpO2: 97%  Weight: 222 lb 9.6 oz (101 kg)  Height: 5\' 8"  (1.727 m)    Body mass index is 33.85 kg/m.  General:  Alert and oriented, no acute distress HEENT: Normal Neck: No bruit or JVD Pulmonary: Clear to auscultation bilaterally Cardiac: Regular Rate and Rhythm without  murmur Abdomen: Soft, non-tender, non-distended, no mass, no scars Skin: No rash           Extremity Pulses:  2+ radial, brachial, femoral, dorsalis pedis, posterior tibial pulses bilaterally Musculoskeletal: No deformity or edema  Neurologic: Upper and lower extremity motor 5/5 and symmetric  DATA:    Venous Reflux Times  +--------------+---------+------+-----------+------------+--------+   RIGHT          Reflux No Reflux Reflux Time Diameter cms Comments                              Yes                                       +--------------+---------+------+-----------+------------+--------+   CFV            no                                                   +--------------+---------+------+-----------+------------+--------+   FV prox        no                                                   +--------------+---------+------+-----------+------------+--------+   FV mid         no                                                   +--------------+---------+------+-----------+------------+--------+   FV dist        no                                                   +--------------+---------+------+-----------+------------+--------+   Popliteal      no                                                   +--------------+---------+------+-----------+------------+--------+   GSV at [facility]     no  0.876                +--------------+---------+------+-----------+------------+--------+   GSV prox thigh no                              0.520                +--------------+---------+------+-----------+------------+--------+   GSV mid thigh  no                              0.475                +--------------+---------+------+-----------+------------+--------+   GSV dist thigh no                              0.392                +--------------+---------+------+-----------+------------+--------+   GSV at knee    no                              0.713                 +--------------+---------+------+-----------+------------+--------+   GSV prox calf                                  0.251                +--------------+---------+------+-----------+------------+--------+   GSV mid calf                                   0.237                +--------------+---------+------+-----------+------------+--------+   SSV Pop Fossa             yes     >500 ms      0.150                +--------------+---------+------+-----------+------------+--------+   SSV prox calf  no                              0.266                +--------------+---------+------+-----------+------------+--------+   SSV mid calf                                   0.393                +--------------+---------+------+-----------+------------+--------+      +--------------+---------+------+-----------+------------+--------+   LEFT           Reflux No Reflux Reflux Time Diameter cms Comments                              Yes                                       +--------------+---------+------+-----------+------------+--------+   CFV  no                                                   +--------------+---------+------+-----------+------------+--------+   FV prox        no                                                   +--------------+---------+------+-----------+------------+--------+   FV mid         no                                                   +--------------+---------+------+-----------+------------+--------+   FV dist        no                                                   +--------------+---------+------+-----------+------------+--------+   Popliteal      no                                                   +--------------+---------+------+-----------+------------+--------+   GSV at [facility]     no                              0.813                +--------------+---------+------+-----------+------------+--------+   GSV prox thigh no                               0.472                +--------------+---------+------+-----------+------------+--------+   GSV mid thigh  no                              0.479                +--------------+---------+------+-----------+------------+--------+   GSV dist thigh no                              0.417                +--------------+---------+------+-----------+------------+--------+   GSV at knee    no                              0.440                +--------------+---------+------+-----------+------------+--------+   GSV prox calf  0.361                +--------------+---------+------+-----------+------------+--------+   GSV mid calf                                   0.440                +--------------+---------+------+-----------+------------+--------+   SSV Pop Fossa  no                              0.220                +--------------+---------+------+-----------+------------+--------+   SSV prox calf  no                              0.150                +--------------+---------+------+-----------+------------+--------+   SSV mid calf                                   0.308                +--------------+---------+------+-----------+------------+--------+       Summary:  Bilateral:  - No evidence of deep vein thrombosis seen in the lower extremities,  bilaterally, from the common femoral through the popliteal veins.  - No evidence of superficial venous thrombosis in the lower extremities,  bilaterally.  - No evidence of deep venous insufficiency seen bilaterally in the lower  extremity.  - No evidence of superficial venous reflux seen in the greater saphenous  veins bilaterally.     Right:  - No evidence of deep vein thrombosis seen in the right lower extremity,  from the common femoral through the popliteal veins.  - No evidence of superficial venous thrombosis in the right lower  extremity.  - Venous reflux is noted in the right short saphenous vein.  -  Varicosities appear to arise from an incompetent, postero-lateral,  superficial pelvic vein.     Left:  - No evidence of deep vein thrombosis seen in the left lower extremity,  from the common femoral through the popliteal veins.  - No evidence of superficial venous thrombosis in the left lower  extremity.  - No evidence of superficial venous reflux seen in the left greater  saphenous vein.  - Varicosities appear to arise from an incompetent, postero-lateral,  superficial pelvic vein.     Assessment: Telangiectasias, varicose veins B LE multiple areas.  She has no significant venous reflux, no skin changes, no history of weeping or edema.     Plan:She will f/u with Dr. Scot Dock for consultation of cosmetic varicose vein stripping and then with Izora Gala for sclero therapy.  Her and her husband have been informed that this is cosmetic surgery.  She will be fitted for thigh high 20-30 mm hg for post intervention compression.    Roxy Horseman PA-C [facility] Office: [phone]  MD in clinic [place]

## 2021-04-27 ENCOUNTER — Encounter: Payer: Self-pay | Admitting: General Practice

## 2021-05-06 ENCOUNTER — Ambulatory Visit: Payer: 59 | Admitting: Vascular Surgery

## 2021-06-04 DIAGNOSIS — M7989 Other specified soft tissue disorders: Secondary | ICD-10-CM

## 2021-06-10 ENCOUNTER — Ambulatory Visit: Payer: 59 | Admitting: Vascular Surgery

## 2021-06-24 ENCOUNTER — Other Ambulatory Visit: Payer: Self-pay

## 2021-06-24 ENCOUNTER — Encounter: Payer: Self-pay | Admitting: Vascular Surgery

## 2021-06-24 ENCOUNTER — Ambulatory Visit: Payer: Managed Care, Other (non HMO) | Admitting: Vascular Surgery

## 2021-06-24 VITALS — BP 122/86 | HR 61 | Temp 97.7°F | Resp 16 | Ht 69.0 in | Wt 220.0 lb

## 2021-06-24 DIAGNOSIS — I83813 Varicose veins of bilateral lower extremities with pain: Secondary | ICD-10-CM | POA: Diagnosis not present

## 2021-06-24 NOTE — Progress Notes (Signed)
? ? ?REASON FOR VISIT:  ? ?Follow-up of chronic venous disease ? ?MEDICAL ISSUES:  ? ?CHRONIC VENOUS INSUFFICIENCY: This patient has CEAP C2 venous disease.  She has painful varicose veins of both lower extremities and also multiple areas of telangiectasias and reticular veins.  She works essentially 12-hour shifts standing at work.  On her duplex scan in December she had no evidence of significant deep or superficial venous reflux.  I have discussed with her the importance of intermittent leg elevation and the proper positioning for this.  I have encouraged her to continue to wear her thigh-high compression stockings with a gradient of 20 to 30 mmHg.  We discussed the importance of exercise pacifically walking and water aerobics.  I encouraged her to avoid prolonged sitting and standing.  She feels that her varicose veins are significantly bothersome and for this reason I think she would be a candidate for 10-20 stab phlebectomies.  She would also require multiple units of sclerotherapy for her multiple telangiectasias and reticular veins. ? ? ?HPI:  ? ?Natasha Figueroa is a pleasant 46 y.o. female who was seen by Gerri Lins, PA on 03/26/2021 with painful varicose veins of both lower extremities.  She also complained of leg swelling.  My history the patient has had varicose veins for many years but over the last 6 years they have gotten more prominent.  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  She experiences significant aching pain and heaviness in her legs which is aggravated by sitting and standing and relieved with elevation.  Her thigh-high stockings help significantly.  She has been wearing 20-30 thigh-high stockings.  She has no previous history of DVT and no previous venous procedures. ? ?Past Medical History:  ?Diagnosis Date  ? Hypertension   ? ? ?Family History  ?Problem Relation Age of Onset  ? Diabetes Father   ? Asthma Mother   ? Hypertension Mother    ? ? ?SOCIAL HISTORY: ?Social History  ? ?Tobacco Use  ? Smoking status: Never  ? Smokeless tobacco: Never  ?Substance Use Topics  ? Alcohol use: Never  ? ? ?Allergies  ?Allergen Reactions  ? Aspirin Other (See Comments) and Palpitations  ?  tachycardia ?tachycardia ?  ? Tramadol Other (See Comments) and Rash  ?  Rash all over body. ?Rash all over body. ?  ? ? ?Current Outpatient Medications  ?Medication Sig Dispense Refill  ? atenolol-chlorthalidone (TENORETIC) 100-25 MG tablet Take 1 tablet by mouth daily.    ? ?No current facility-administered medications for this visit.  ? ? ?REVIEW OF SYSTEMS:  ?'[X]'$  denotes positive finding, '[ ]'$  denotes negative finding ?Cardiac  Comments:  ?Chest pain or chest pressure:    ?Shortness of breath upon exertion:    ?Short of breath when lying flat:    ?Irregular heart rhythm:    ?    ?Vascular    ?Pain in calf, thigh, or hip brought on by ambulation:    ?Pain in feet at night that wakes you up from your sleep:     ?Blood clot in your veins:    ?Leg swelling:     ?    ?Pulmonary    ?Oxygen at home:    ?Productive cough:     ?Wheezing:     ?    ?Neurologic    ?Sudden weakness in arms or legs:     ?Sudden numbness in arms or legs:     ?  Sudden onset of difficulty speaking or slurred speech:    ?Temporary loss of vision in one eye:     ?Problems with dizziness:     ?    ?Gastrointestinal    ?Blood in stool:     ?Vomited blood:     ?    ?Genitourinary    ?Burning when urinating:     ?Blood in urine:    ?    ?Psychiatric    ?Major depression:     ?    ?Hematologic    ?Bleeding problems:    ?Problems with blood clotting too easily:    ?    ?Skin    ?Rashes or ulcers:    ?    ?Constitutional    ?Fever or chills:    ? ?PHYSICAL EXAM:  ? ?Vitals:  ? 06/24/21 1308  ?BP: 122/86  ?Pulse: 61  ?Resp: 16  ?Temp: 97.7 ?F (36.5 ?C)  ?TempSrc: Temporal  ?SpO2: 96%  ?Weight: 220 lb (99.8 kg)  ?Height: '5\' 9"'$  (1.753 m)  ? ? ?GENERAL: The patient is a well-nourished female, in no acute distress. The  vital signs are documented above. ?CARDIAC: There is a regular rate and rhythm.  ?VASCULAR: I do not detect carotid bruits. ?She has palpable pedal pulses. ?She has multiple areas of telangiectasias and reticular veins as documented in the note from December of last year. ?She has some dilated varicose veins in both calves as documented in the photographs below. ? ?PULMONARY: There is good air exchange bilaterally without wheezing or rales. ? ? ? ?MUSCULOSKELETAL: There are no major deformities or cyanosis. ?NEUROLOGIC: No focal weakness or paresthesias are detected. ?SKIN: There are no ulcers or rashes noted. ?PSYCHIATRIC: The patient has a normal affect. ? ?DATA:   ? ?VENOUS DUPLEX: I have reviewed the venous duplex scan that was done on 04/10/2021. ? ?On the right side there was no evidence of DVT.  There was no deep venous reflux.  There was no superficial venous reflux. ? ?On the left side, there was no evidence of DVT.  There was no deep venous reflux.  There was no superficial venous reflux. ? ?Deitra Mayo ?[facility] [place] [facility] [place] ?Office [phone] ?

## 2021-07-31 ENCOUNTER — Ambulatory Visit: Payer: Managed Care, Other (non HMO)

## 2021-08-09 ENCOUNTER — Other Ambulatory Visit: Payer: Self-pay

## 2021-08-09 ENCOUNTER — Emergency Department (HOSPITAL_COMMUNITY): Payer: Managed Care, Other (non HMO)

## 2021-08-09 ENCOUNTER — Emergency Department (HOSPITAL_COMMUNITY)
Admission: EM | Admit: 2021-08-09 | Discharge: 2021-08-09 | Disposition: A | Discharge status: Home / Self Care | Payer: Managed Care, Other (non HMO) | Attending: Student | Admitting: Student

## 2021-08-09 ENCOUNTER — Encounter (HOSPITAL_COMMUNITY): Payer: Self-pay

## 2021-08-09 DIAGNOSIS — R1011 Right upper quadrant pain: Secondary | ICD-10-CM | POA: Insufficient documentation

## 2021-08-09 DIAGNOSIS — Z9049 Acquired absence of other specified parts of digestive tract: Secondary | ICD-10-CM | POA: Insufficient documentation

## 2021-08-09 LAB — URINALYSIS, ROUTINE W REFLEX MICROSCOPIC
Bilirubin Urine: NEGATIVE
Glucose, UA: NEGATIVE mg/dL
Hgb urine dipstick: NEGATIVE
Ketones, ur: NEGATIVE mg/dL
Leukocytes,Ua: NEGATIVE
Nitrite: NEGATIVE
Protein, ur: NEGATIVE mg/dL
Specific Gravity, Urine: 1.011 (ref 1.005–1.030)
pH: 5 (ref 5.0–8.0)

## 2021-08-09 LAB — COMPREHENSIVE METABOLIC PANEL
ALT: 25 U/L (ref 0–44)
AST: 21 U/L (ref 15–41)
Albumin: 3.8 g/dL (ref 3.5–5.0)
Alkaline Phosphatase: 64 U/L (ref 38–126)
Anion gap: 7 (ref 5–15)
BUN: 13 mg/dL (ref 6–20)
CO2: 28 mmol/L (ref 22–32)
Calcium: 9.5 mg/dL (ref 8.9–10.3)
Chloride: 104 mmol/L (ref 98–111)
Creatinine, Ser: 0.64 mg/dL (ref 0.44–1.00)
GFR, Estimated: >60 mL/min (ref >60)
Glucose, Bld: 100 mg/dL — ABNORMAL HIGH (ref 70–99)
Potassium: 3.6 mmol/L (ref 3.5–5.1)
Sodium: 139 mmol/L (ref 135–145)
Total Bilirubin: 0.6 mg/dL (ref 0.3–1.2)
Total Protein: 7.3 g/dL (ref 6.5–8.1)

## 2021-08-09 LAB — CBC
HCT: 41.6 % (ref 36.0–46.0)
Hemoglobin: 14.2 g/dL (ref 12.0–15.0)
MCH: 30.3 pg (ref 26.0–34.0)
MCHC: 34.1 g/dL (ref 30.0–36.0)
MCV: 88.7 fL (ref 80.0–100.0)
Platelets: 247 10*3/uL (ref 150–400)
RBC: 4.69 MIL/uL (ref 3.87–5.11)
RDW: 12.8 % (ref 11.5–15.5)
WBC: 7.8 10*3/uL (ref 4.0–10.5)
nRBC: 0 % (ref 0.0–0.2)

## 2021-08-09 LAB — I-STAT BETA HCG BLOOD, ED (MC, WL, AP ONLY): I-stat hCG, quantitative: <5 m[IU]/mL (ref <5)

## 2021-08-09 LAB — LIPASE, BLOOD: Lipase: 32 U/L (ref 11–51)

## 2021-08-09 MED ORDER — LIDOCAINE 5 % EX PTCH
1.0000 | MEDICATED_PATCH | CUTANEOUS | Status: DC
  Administered 2021-08-09: 1 via TRANSDERMAL
  Filled 2021-08-09: qty 1

## 2021-08-09 MED ORDER — DICYCLOMINE HCL 20 MG PO TABS: 20.0000 mg | ORAL_TABLET | Freq: Two times a day (BID) | ORAL | 0 refills | Status: DC

## 2021-08-09 MED ORDER — LIDOCAINE 4 % EX PTCH: 1.0000 | MEDICATED_PATCH | Freq: Two times a day (BID) | CUTANEOUS | 0 refills | Status: DC

## 2021-08-09 NOTE — ED Provider Notes (Signed)
?[place] ?Provider Note ? ? ?CSN: [id number] ?Arrival date & time: 08/09/21  1604 ? ?  ? ?History ?Chief Complaint  ?Patient presents with  ? Abdominal Pain  ? ? ?Natasha Figueroa is a 46 y.o. female. ? ? ?Abdominal Pain ? ?Patient is a 46 year old female with a history of cholecystectomy presents to the emergency department for 3 days of right upper quadrant pain that has been intermittent and cramping.  She reports pain is worse with movement and after eating food specifically fatty food.  Denies radiation of the pain.  Denies associated nausea, vomiting or diarrhea.  Denies fever or chills.  Denies any injury to the area.  She denies any spicy food consumption.  She has tried Tylenol with minimal pain improvement.  Denies chest pain or shortness of breath.  Denies any urinary symptoms.  Otherwise no other complaints. ? ?Home Medications ?Prior to Admission medications   ?Medication Sig Start Date End Date Taking? Authorizing Provider  ?dicyclomine (BENTYL) 20 MG tablet Take 1 tablet (20 mg total) by mouth 2 (two) times daily. 08/09/21  Yes Kommor, Madison, MD  ?dicyclomine (BENTYL) 20 MG tablet Take 1 tablet (20 mg total) by mouth 2 (two) times daily. 08/09/21  Yes Loralei Radcliffe, [facility], MD  ?lidocaine (SALONPAS PAIN RELIEVING) 4 % Place 1 patch onto the skin every 12 (twelve) hours. 08/09/21  Yes Kommor, Madison, MD  ?atenolol-chlorthalidone (TENORETIC) 100-25 MG tablet Take 1 tablet by mouth daily. 12/30/19   [provider]  ?   ? ?Allergies    ?Aspirin and Tramadol   ? ?Review of Systems   ?Review of Systems  ?Gastrointestinal:  Positive for abdominal pain.  ? ?Physical Exam ?Updated Vital Signs ?BP 136/88   Pulse (!) 59   Temp 98.9 ?F (37.2 ?C) (Oral)   Resp 16   Ht '5\' 9"'$  (1.753 m)   Wt 99.8 kg   SpO2 99%   BMI 32.49 kg/m?  ?Physical Exam ?Vitals and nursing note reviewed.  ?Constitutional:   ?   General: She is not in acute distress. ?   Appearance: She is  well-developed. She is obese.  ?HENT:  ?   Head: Normocephalic and atraumatic.  ?Eyes:  ?   Conjunctiva/sclera: Conjunctivae normal.  ?Cardiovascular:  ?   Rate and Rhythm: Normal rate and regular rhythm.  ?   Heart sounds: No murmur heard. ?Pulmonary:  ?   Effort: Pulmonary effort is normal. No respiratory distress.  ?   Breath sounds: Normal breath sounds.  ?Abdominal:  ?   Palpations: Abdomen is soft.  ?   Tenderness: There is abdominal tenderness in the right upper quadrant. There is no right CVA tenderness, left CVA tenderness, guarding or rebound.  ?Musculoskeletal:     ?   General: No swelling.  ?   Cervical back: Neck supple.  ?Skin: ?   General: Skin is warm and dry.  ?   Capillary Refill: Capillary refill takes less than 2 seconds.  ?Neurological:  ?   Mental Status: She is alert.  ?Psychiatric:     ?   Mood and Affect: Mood normal.  ? ? ?ED Results / Procedures / Treatments   ?Labs ?(all labs ordered are listed, but only abnormal results are displayed) ?Labs Reviewed  ?COMPREHENSIVE METABOLIC PANEL - Abnormal; Notable for the following components:  ?    Result Value  ? Glucose, Bld 100 (*)   ? All other components within normal limits  ?LIPASE, BLOOD  ?  CBC  ?URINALYSIS, ROUTINE W REFLEX MICROSCOPIC  ?I-STAT BETA HCG BLOOD, ED ([facility], [facility], [facility] ONLY)  ? ? ?EKG ?None ? ?Radiology ?US Abdomen Limited RUQ (LIVER/GB) ? ?Result Date: 08/09/2021 ?CLINICAL DATA:  46 year old female with acute RIGHT UPPER quadrant abdominal pain for 4 days. History of cholecystectomy. EXAM: ULTRASOUND ABDOMEN LIMITED RIGHT UPPER QUADRANT COMPARISON:  06/13/2020 CT FINDINGS: Gallbladder: The gallbladder is not visualized compatible with cholecystectomy. Common bile duct: Diameter: 5.8 mm. There is no evidence of intrahepatic or extrahepatic biliary dilatation. Liver: No focal lesion identified. Within normal limits in parenchymal echogenicity. Portal vein is patent on color Doppler imaging with normal direction of blood flow towards the  liver. Other: None. IMPRESSION: 1. No acute abnormality. 2. Normal liver.  No biliary dilatation. 3. Status post cholecystectomy. Electronically Signed   By: Margarette Canada M.D.   On: 08/09/2021 19:25   ? ?Procedures ?Procedures  ? ?Medications Ordered in ED ?Medications  ?lidocaine (LIDODERM) 5 % 1 patch (1 patch Transdermal Patch Applied 08/09/21 1943)  ? ? ?ED Course/ Medical Decision Making/ A&P ?  ?                        ?Medical Decision Making ?Problems Addressed: ?RUQ abdominal pain: acute illness or injury that poses a threat to life or bodily functions ? ?Amount and/or Complexity of Data Reviewed ?External Data Reviewed: notes. ?Labs: ordered. Decision-making details documented in ED Course. ?Radiology: ordered and independent interpretation performed. Decision-making details documented in ED Course. ? ?Risk ?OTC drugs. ?Prescription drug management. ? ? ?Patient is a 46 year old female with a history of cholecystectomy who presents to the emergency department for 3 days of intermittent right upper quadrant pain.  Physical exam as above.  Her vital signs are remarkable for mild bradycardia and mildly elevated blood pressure.  No acute abdomen on my exam.  No guarding or rebound. ? ?Patient presentation is most likely hepatobiliary etiology despite her history of cholecystectomy.  Other differential include GERD.  Less concern for nephrolithiasis or urinary infection.  Patient CBC without leukocytosis and her be without severe metabolic derangement.  Urine pregnancy is negative.  Urinalysis unremarkable.  No leukocyte or nitrite.  Obtained a right upper quadrant ultrasound which showed acute biliary etiology.  ? ?Patient presentation can be musculoskeletal.  We will provide lidocaine patch to the area.  In addition we will send patient with Bentyl.  Strict return precaution has been discussed.  Recommend follow-up with PCP in 1 to 2 days. ? ? ?Final Clinical Impression(s) / ED Diagnoses ?Final diagnoses:  ?RUQ  abdominal pain  ? ? ?Rx / DC Orders ?ED Discharge Orders   ? ?      Ordered  ?  dicyclomine (BENTYL) 20 MG tablet  2 times daily       ? 08/09/21 1930  ?  lidocaine (SALONPAS PAIN RELIEVING) 4 %  Every 12 hours       ? 08/09/21 1941  ?  dicyclomine (BENTYL) 20 MG tablet  2 times daily       ? 08/09/21 1949  ? ?  ?  ? ?  ? ? ?  ?Donnamarie Poag, MD ?08/09/21 2206 ? ?  ?Teressa Lower, MD ?08/09/21 2329 ? ?

## 2021-08-09 NOTE — ED Triage Notes (Signed)
Pt c/o RUQ pain of abdomenx3 days. Pt denies N/V/D.  ?

## 2021-08-10 LAB — POC URINE PREG, ED: Preg Test, Ur: NEGATIVE

## 2021-08-12 ENCOUNTER — Ambulatory Visit (INDEPENDENT_AMBULATORY_CARE_PROVIDER_SITE_OTHER): Payer: Managed Care, Other (non HMO) | Admitting: Obstetrics & Gynecology

## 2021-08-12 ENCOUNTER — Encounter: Payer: Self-pay | Admitting: Obstetrics & Gynecology

## 2021-08-12 VITALS — BP 123/89 | HR 64 | Ht 69.0 in | Wt 219.0 lb

## 2021-08-12 DIAGNOSIS — Z1211 Encounter for screening for malignant neoplasm of colon: Secondary | ICD-10-CM

## 2021-08-12 DIAGNOSIS — Z1231 Encounter for screening mammogram for malignant neoplasm of breast: Secondary | ICD-10-CM

## 2021-08-12 DIAGNOSIS — Z01419 Encounter for gynecological examination (general) (routine) without abnormal findings: Secondary | ICD-10-CM | POA: Diagnosis not present

## 2021-08-12 NOTE — Progress Notes (Signed)
Subjective:  ?  ? Natasha Figueroa is a 46 y.o. female here for a routine exam.  Current complaints: no GYN complaints. 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  Pts maternal aunt had colon cancer when she was her age. She is now 25. Pt is worried about her risk because of that.  ?  ?Gynecologic History ?No LMP recorded. (Menstrual status: Other). ?Contraception: none ?Last Pap: 06/23/2020. Results were: normal ?Last mammogram: 08/2021. Results were: normal ? ?Obstetric History ?OB History  ?Gravida Para Term Preterm AB Living  ?'3 3 3     3  '$ ?SAB IAB Ectopic Multiple Live Births  ?        3  ?  ?# Outcome Date GA Lbr Len/2nd Weight Sex Delivery Anes PTL Lv  ?3 Term 2001 [redacted]w[redacted]d  F Vag-Spont None N LIV  ?2 Term 1999 427w0d F Vag-Spont None N LIV  ?1 Term [id number] Vag-Spont None N LIV  ? ? ? ?The following portions of the patient's history were reviewed and updated as appropriate: allergies, current medications, past family history, past medical history, past social history, past surgical history, and problem list. ? ?Review of Systems ?Pertinent items are noted in HPI.  ?  ?Objective:  ?BP 123/89   Pulse 64   Ht '5\' 9"'$  (1.753 m)   Wt 219 lb (99.3 kg)   BMI 32.34 kg/m?  ? ?General Appearance:    Alert, cooperative, no distress, appears stated age  ?Head:    Normocephalic, without obvious abnormality, atraumatic  ?Eyes:    conjunctiva/corneas clear, EOM's intact, both eyes  ?Ears:    Normal external ear canals, both ears  ?Nose:   Nares normal, septum midline, mucosa normal, no drainage    or sinus tenderness  ?Throat:   Lips, mucosa, and tongue normal; teeth and gums normal  ?Neck:   Supple, symmetrical, trachea midline, no adenopathy;  ?  thyroid:  no enlargement/tenderness/nodules  ?Back:     Symmetric, no curvature, ROM normal, no CVA tenderness  ?Lungs:     respirations unlabored  ?Chest Wall:    No tenderness or deformity  ? Heart:    Regular rate and rhythm  ?Breast Exam:    No tenderness, masses, or  nipple abnormality  ?Abdomen:     Soft, non-tender, bowel sounds active all four quadrants,  ?  no masses, no organomegaly  ?Genitalia:    Normal female without lesion, discharge or tenderness  ?   ?Extremities:   Extremities normal, atraumatic, no cyanosis or edema  ?Pulses:   2+ and symmetric all extremities  ?Skin:   Skin color, texture, turgor normal, no rashes or lesions  ?  ? ?Assessment:  ? ? Healthy female exam.  ?  ?Plan:  ?LizSorrels seen today for gynecologic exam. ? ?Diagnoses and all orders for this visit: ? ?Well female exam with routine gynecological exam ? ?Breast cancer screening by mammogram ?-     Cancel: MS Digital Screening; Future ?-     MM 3D SCREEN BREAST BILATERAL; Future ? ?Screening for colon cancer ?-     Ambulatory referral to Gastroenterology ? ? F/u in 1 year or sooner prn  ? ?Natasha Figueroa L. Harraway-Smith, M.D., FACFulshear?

## 2021-08-12 NOTE — Progress Notes (Signed)
Last Pap 06/23/20. ?Last mammogram 07/08/20. Kathrene Alu RN  ?

## 2021-08-17 ENCOUNTER — Ambulatory Visit (HOSPITAL_BASED_OUTPATIENT_CLINIC_OR_DEPARTMENT_OTHER)
Admission: RE | Admit: 2021-08-17 | Discharge: 2021-08-17 | Disposition: A | Discharge status: Home / Self Care | Payer: Managed Care, Other (non HMO) | Source: Ambulatory Visit | Attending: Obstetrics & Gynecology | Admitting: Obstetrics & Gynecology

## 2021-08-17 ENCOUNTER — Encounter (HOSPITAL_BASED_OUTPATIENT_CLINIC_OR_DEPARTMENT_OTHER): Payer: Self-pay

## 2021-08-17 DIAGNOSIS — Z1231 Encounter for screening mammogram for malignant neoplasm of breast: Secondary | ICD-10-CM | POA: Insufficient documentation

## 2021-08-28 ENCOUNTER — Ambulatory Visit: Payer: Managed Care, Other (non HMO)

## 2021-09-01 ENCOUNTER — Ambulatory Visit: Payer: Managed Care, Other (non HMO) | Admitting: Medical

## 2021-09-01 ENCOUNTER — Telehealth: Payer: Self-pay

## 2021-09-01 NOTE — Telephone Encounter (Signed)
Okay to reschedule pt ?

## 2021-09-01 NOTE — Telephone Encounter (Signed)
Received phone call from Pt's husband Bradly Chris- he is requesting to reschedule his wife's NP appt- she did no show her appt this morning- she was called into work at 9pm last night and just got off work. Informed that I would need verification from Percell Miller to reschedule as she did no show today. Emilio verbalized understanding. He can be reached at [phone].

## 2021-09-02 NOTE — Telephone Encounter (Signed)
Can you get pt r/s'd

## 2021-09-02 NOTE — Telephone Encounter (Signed)
Called pt at cell and spouse phone - no answer and no voice mail set up. Could not schedule yet.

## 2021-09-24 ENCOUNTER — Other Ambulatory Visit: Payer: Self-pay

## 2021-09-24 ENCOUNTER — Emergency Department (HOSPITAL_COMMUNITY)
Admission: EM | Admit: 2021-09-24 | Discharge: 2021-09-25 | Discharge status: Against Medical Advice | Payer: Managed Care, Other (non HMO) | Attending: Emergency Medicine | Admitting: Emergency Medicine

## 2021-09-24 ENCOUNTER — Emergency Department (HOSPITAL_COMMUNITY): Payer: Managed Care, Other (non HMO)

## 2021-09-24 ENCOUNTER — Encounter (HOSPITAL_COMMUNITY): Payer: Self-pay

## 2021-09-24 DIAGNOSIS — M79602 Pain in left arm: Secondary | ICD-10-CM | POA: Diagnosis present

## 2021-09-24 DIAGNOSIS — R0602 Shortness of breath: Secondary | ICD-10-CM | POA: Diagnosis not present

## 2021-09-24 DIAGNOSIS — R079 Chest pain, unspecified: Secondary | ICD-10-CM | POA: Diagnosis not present

## 2021-09-24 DIAGNOSIS — R110 Nausea: Secondary | ICD-10-CM | POA: Diagnosis not present

## 2021-09-24 DIAGNOSIS — Z5321 Procedure and treatment not carried out due to patient leaving prior to being seen by health care provider: Secondary | ICD-10-CM | POA: Insufficient documentation

## 2021-09-24 LAB — BASIC METABOLIC PANEL
Anion gap: 11 (ref 5–15)
BUN: 9 mg/dL (ref 6–20)
CO2: 28 mmol/L (ref 22–32)
Calcium: 9.3 mg/dL (ref 8.9–10.3)
Chloride: 99 mmol/L (ref 98–111)
Creatinine, Ser: 0.68 mg/dL (ref 0.44–1.00)
GFR, Estimated: >60 mL/min (ref >60)
Glucose, Bld: 97 mg/dL (ref 70–99)
Potassium: 3.3 mmol/L — ABNORMAL LOW (ref 3.5–5.1)
Sodium: 138 mmol/L (ref 135–145)

## 2021-09-24 LAB — CBC
HCT: 38 % (ref 36.0–46.0)
Hemoglobin: 13.2 g/dL (ref 12.0–15.0)
MCH: 30.6 pg (ref 26.0–34.0)
MCHC: 34.7 g/dL (ref 30.0–36.0)
MCV: 88.2 fL (ref 80.0–100.0)
Platelets: 234 10*3/uL (ref 150–400)
RBC: 4.31 MIL/uL (ref 3.87–5.11)
RDW: 12.5 % (ref 11.5–15.5)
WBC: 9.4 10*3/uL (ref 4.0–10.5)
nRBC: 0 % (ref 0.0–0.2)

## 2021-09-24 LAB — I-STAT BETA HCG BLOOD, ED (MC, WL, AP ONLY): I-stat hCG, quantitative: <5 m[IU]/mL (ref <5)

## 2021-09-24 LAB — TROPONIN I (HIGH SENSITIVITY): Troponin I (High Sensitivity): 4 ng/L (ref <18)

## 2021-09-24 NOTE — ED Triage Notes (Signed)
COmplains of left chest pain and burning sensation down left arm since 0600 this morning. Had nausea sweating and sob this am but it has subsided.

## 2021-09-24 NOTE — ED Provider Triage Note (Signed)
Emergency Medicine Provider Triage Evaluation Note  Natasha Figueroa , a 46 y.o. female  was evaluated in triage.  Pt complains of left arm pain   Review of Systems  Positive:  Negative:   Physical Exam  BP (!) 157/108 (BP Location: Right Arm)   Pulse 71   Temp 98.4 F (36.9 C) (Oral)   Resp 16   Ht '5\' 9"'$  (1.753 m)   Wt 99.8 kg   SpO2 94%   BMI 32.49 kg/m  Gen:   Awake, no distress   Resp:  Normal effort MSK:   Moves extremities without difficulty  Other:    Medical Decision Making  Medically screening exam initiated at 5:38 PM.  Appropriate orders placed.  Natasha Figueroa was informed that the remainder of the evaluation will be completed by another provider, this initial triage assessment does not replace that evaluation, and the importance of remaining in the ED until their evaluation is complete.     Fransico Meadow, [place] 09/24/21 1739

## 2021-09-25 NOTE — ED Notes (Signed)
X2 no response for vitals recheck  

## 2021-12-17 ENCOUNTER — Encounter: Payer: Self-pay | Admitting: Emergency Medicine

## 2021-12-17 ENCOUNTER — Ambulatory Visit: Payer: Managed Care, Other (non HMO) | Admitting: Emergency Medicine

## 2022-06-22 ENCOUNTER — Encounter: Payer: Self-pay | Admitting: General Practice

## 2022-08-27 ENCOUNTER — Encounter: Payer: Self-pay | Admitting: Obstetrics and Gynecology

## 2022-08-27 ENCOUNTER — Ambulatory Visit (INDEPENDENT_AMBULATORY_CARE_PROVIDER_SITE_OTHER): Payer: Managed Care, Other (non HMO) | Admitting: Obstetrics and Gynecology

## 2022-08-27 VITALS — BP 116/79 | HR 77 | Ht 69.0 in | Wt 188.0 lb

## 2022-08-27 DIAGNOSIS — Z01419 Encounter for gynecological examination (general) (routine) without abnormal findings: Secondary | ICD-10-CM | POA: Diagnosis not present

## 2022-08-27 DIAGNOSIS — R232 Flushing: Secondary | ICD-10-CM | POA: Diagnosis not present

## 2022-08-27 NOTE — Progress Notes (Unsigned)
ANNUAL EXAM Patient name: Natasha Figueroa MRN [id number]  Date of birth: April 16, 1975 Chief Complaint:   Gynecologic Exam  History of Present Illness:   Natasha Figueroa is a 47 y.o. G3P3003 being seen today for a routine annual exam.  Current complaints: annual  Menstrual concerns? No   Breast or nipple changes? No  Contraception use? No  Sexually active? Yes no pain  For the last few months has been having hot flashes at night or e  No LMP recorded. (Menstrual status: Other).   The pregnancy intention screening data noted above was reviewed. Potential methods of contraception were discussed. The patient elected to proceed with No data recorded.   Last pap     Component Value Date/Time   DIAGPAP  06/23/2020 1016    - Negative for intraepithelial lesion or malignancy (NILM)   HPVHIGH Negative 06/23/2020 1016   ADEQPAP  06/23/2020 1016    Satisfactory for evaluation; transformation zone component PRESENT.    High Risk HPV: Positive  Adequacy:  Satisfactory for evaluation, transformation zone component PRESENT  Diagnosis:  Atypical squamous cells of undetermined significance (ASC-US)  Last mammogram: 08/2021 BIRADS1. Results were: {normal, abnormal, n/a:23837}. Family h/o breast cancer: {yes***/no:23838} Last colonoscopy: ***. Results were: {normal, abnormal, n/a:23837}. Family h/o colorectal cancer: {yes***/no:23838}     08/27/2022   10:17 AM  Depression screen PHQ 2/9  Decreased Interest 0  Down, Depressed, Hopeless 0  PHQ - 2 Score 0  Altered sleeping 0  Tired, decreased energy 0  Change in appetite 0  Feeling bad or failure about yourself  0  Trouble concentrating 0  Moving slowly or fidgety/restless 0  Suicidal thoughts 0  PHQ-9 Score 0        08/27/2022   10:18 AM  GAD 7 : Generalized Anxiety Score  Nervous, Anxious, on Edge 0  Control/stop worrying 0  Worry too much - different things 0  Trouble relaxing 0  Restless 0  Easily annoyed or irritable 0  Afraid -  awful might happen 0  Total GAD 7 Score 0     Review of Systems:   Pertinent items are noted in HPI Denies any headaches, blurred vision, fatigue, shortness of breath, chest pain, abdominal pain, abnormal vaginal discharge/itching/odor/irritation, problems with periods, bowel movements, urination, or intercourse unless otherwise stated above. Pertinent History Reviewed:  Reviewed past medical,surgical, social and family history.  Reviewed problem list, medications and allergies. Physical Assessment:   Vitals:   08/27/22 1003  BP: 116/79  Pulse: 77  Weight: 188 lb (85.3 kg)  Height: 5\' 9"  (1.753 m)  Body mass index is 27.76 kg/m.        Physical Examination:   General appearance - well appearing, and in no distress  Mental status - alert, oriented to person, place, and time  Psych:  She has a normal mood and affect  Skin - warm and dry, normal color, no suspicious lesions noted  Chest - effort normal, all lung fields clear to auscultation bilaterally  Heart - normal rate and regular rhythm  Breasts - breasts appear normal, no suspicious masses, no skin or nipple changes or  axillary nodes  Abdomen - soft, nontender, nondistended, no masses or organomegaly  Pelvic -  VULVA: normal appearing vulva with no masses, tenderness or lesions   VAGINA: normal appearing vagina with normal color and discharge, no lesions   CERVIX: normal appearing cervix without discharge or lesions, no CMT  Thin prep pap is {Desc; done/not:10129} *** HR HPV cotesting  UTERUS: uterus is felt to be normal size, shape, consistency and nontender   ADNEXA: No adnexal masses or tenderness noted.  Extremities:  No swelling or varicosities noted  Chaperone present for exam  No results found for this or any previous visit (from the past 24 hour(s)).    Assessment & Plan:  There are no diagnoses linked to this encounter.  Labs/procedures today: ***  Mammogram: {Mammo f/u:25212::"@ 46yo"}, or sooner if  problems Colonoscopy: {TCS f/u:25213::"@ 45yo"}, or sooner if problems  No orders of the defined types were placed in this encounter.   Meds: No orders of the defined types were placed in this encounter.   Follow-up: No follow-ups on file.  Lorriane Shire, MD 08/27/2022 10:30 AM

## 2022-08-28 LAB — FOLLICLE STIMULATING HORMONE: FSH: 1.9 m[IU]/mL

## 2022-08-28 LAB — ESTRADIOL: Estradiol: 17.3 pg/mL

## 2022-08-30 ENCOUNTER — Telehealth: Payer: Self-pay

## 2022-08-30 NOTE — Telephone Encounter (Signed)
-----   Message from Lorriane Shire, MD sent at 08/30/2022  8:52 AM EDT ----- Notify that estrogen level is low, so likely transitioning into menopause

## 2022-08-30 NOTE — Telephone Encounter (Signed)
Called patient to inform her that her estrogen level is low, she is likely transitioning into menopause. Left message for patient to call the office back.  Mikey Bussing, CMA   [facility] interpreter [place] [phone]

## 2022-09-03 ENCOUNTER — Encounter: Payer: Self-pay | Admitting: Internal Medicine

## 2022-09-07 ENCOUNTER — Ambulatory Visit (HOSPITAL_BASED_OUTPATIENT_CLINIC_OR_DEPARTMENT_OTHER)
Admission: RE | Admit: 2022-09-07 | Discharge: 2022-09-07 | Disposition: A | Discharge status: Home / Self Care | Payer: Managed Care, Other (non HMO) | Source: Ambulatory Visit | Attending: Obstetrics and Gynecology | Admitting: Obstetrics and Gynecology

## 2022-09-07 ENCOUNTER — Encounter (HOSPITAL_BASED_OUTPATIENT_CLINIC_OR_DEPARTMENT_OTHER): Payer: Self-pay

## 2022-09-07 DIAGNOSIS — Z01419 Encounter for gynecological examination (general) (routine) without abnormal findings: Secondary | ICD-10-CM | POA: Diagnosis present

## 2022-09-07 DIAGNOSIS — Z1231 Encounter for screening mammogram for malignant neoplasm of breast: Secondary | ICD-10-CM | POA: Diagnosis not present

## 2022-09-21 ENCOUNTER — Telehealth: Payer: Self-pay | Admitting: *Deleted

## 2022-09-21 NOTE — Telephone Encounter (Signed)
Noted Plavix is on medication list from outside source.  I spoke with pt and she says she is not taking this medicine.  The only medication she is on is her BP medication

## 2022-09-24 ENCOUNTER — Encounter: Payer: Self-pay | Admitting: Internal Medicine

## 2022-09-24 ENCOUNTER — Ambulatory Visit (AMBULATORY_SURGERY_CENTER): Payer: Managed Care, Other (non HMO)

## 2022-09-24 VITALS — Ht 68.0 in | Wt 198.0 lb

## 2022-09-24 DIAGNOSIS — Z1211 Encounter for screening for malignant neoplasm of colon: Secondary | ICD-10-CM

## 2022-09-24 MED ORDER — NA SULFATE-K SULFATE-MG SULF 17.5-3.13-1.6 GM/177ML PO SOLN: 1.0000 | Freq: Once | ORAL | 0 refills | Status: AC

## 2022-09-24 NOTE — Progress Notes (Signed)
Spanish interpreter present during PV appt=Natasha Figueroa  No egg or soy allergy known to patient; No issues known to pt with past sedation with any surgeries or procedures; Patient denies ever being told they had issues or difficulty with intubation; No FH of Malignant Hyperthermia; Pt is not on diet pills; Pt is not on home 02;  Pt is not on blood thinners;  Pt reports  issues with constipation - patient reports she eats lots of fiber, uses fleet enema once per month (as needed); as well as a stool softener 3x per week;  No A fib or A flutter Have any cardiac testing pending--NO Pt instructed to use Single[employer]com or GoodRx for a price reduction on prep   Insurance verified during PV appt=[employer]  Patient's chart reviewed by Cathlyn Parsons CNRA prior to previsit and patient appropriate for the [facility].  Previsit completed and red dot placed by patient's name on their procedure day (on provider's schedule).    Instructions printed and gone over during PV with interpreter present  Patient reports her last "seizure"- was not really a seizure and the neurologist reported that it was not really a seizure;

## 2022-10-22 ENCOUNTER — Encounter: Payer: Managed Care, Other (non HMO) | Admitting: Internal Medicine

## 2022-10-26 ENCOUNTER — Encounter: Payer: Managed Care, Other (non HMO) | Admitting: Internal Medicine

## 2022-11-02 ENCOUNTER — Telehealth: Payer: Self-pay | Admitting: Internal Medicine

## 2022-11-02 ENCOUNTER — Encounter: Payer: Managed Care, Other (non HMO) | Admitting: Internal Medicine

## 2022-11-02 NOTE — Telephone Encounter (Signed)
Good Afternoon Dr. Leonides Schanz,  I called this patient today at 1:09pm  to see if she was coming for her procedure, I spoke with her husband Marlowe Sax and he stated she was working and that he thought she had cancelled her appointment.  However, I did not find any notes.  She has cancelled 2 times before.  I will NO SHOW this patient.  [employer]

## 2023-02-21 ENCOUNTER — Ambulatory Visit: Payer: Managed Care, Other (non HMO) | Admitting: Obstetrics and Gynecology

## 2023-08-01 ENCOUNTER — Ambulatory Visit: Admitting: Obstetrics and Gynecology

## 2023-09-09 ENCOUNTER — Ambulatory Visit (INDEPENDENT_AMBULATORY_CARE_PROVIDER_SITE_OTHER): Admitting: Obstetrics and Gynecology

## 2023-09-09 ENCOUNTER — Other Ambulatory Visit (HOSPITAL_COMMUNITY)
Admission: RE | Admit: 2023-09-09 | Discharge: 2023-09-09 | Disposition: A | Discharge status: Home / Self Care | Source: Ambulatory Visit | Attending: Obstetrics and Gynecology | Admitting: Obstetrics and Gynecology

## 2023-09-09 VITALS — BP 135/85 | HR 64 | Ht 69.0 in | Wt 214.0 lb

## 2023-09-09 DIAGNOSIS — Z01419 Encounter for gynecological examination (general) (routine) without abnormal findings: Secondary | ICD-10-CM | POA: Diagnosis not present

## 2023-09-09 DIAGNOSIS — N912 Amenorrhea, unspecified: Secondary | ICD-10-CM | POA: Diagnosis not present

## 2023-09-09 DIAGNOSIS — Z124 Encounter for screening for malignant neoplasm of cervix: Secondary | ICD-10-CM

## 2023-09-09 DIAGNOSIS — R232 Flushing: Secondary | ICD-10-CM | POA: Diagnosis not present

## 2023-09-09 NOTE — Progress Notes (Signed)
 ANNUAL EXAM Patient name: Natasha Figueroa MRN [id number]  Date of birth: 1976-01-28 Chief Complaint:   Gynecologic Exam (Complaining of hot flashes, night sweats and vaginal dryness)  History of Present Illness:   Natasha Figueroa is a 48 y.o. G3P3003 being seen today for a routine annual exam.  Current complaints: hot flashes  Menstrual concerns? No  7 years ago was last menses Breast or nipple changes? No  Contraception use? Yes postmenopause Sexually active? Yes no pain with intercourse   She reports that she has been having terrible hot flashes.   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 Hot flashes started about 3 years ago.    No LMP recorded. Patient is postmenopausal.   The pregnancy intention screening data noted above was reviewed. Potential methods of contraception were discussed. The patient elected to proceed with No data recorded.   Last pap     Component Value Date/Time   DIAGPAP  06/23/2020 1016    - Negative for intraepithelial lesion or malignancy (NILM)   HPVHIGH Negative 06/23/2020 1016   ADEQPAP  06/23/2020 1016    Satisfactory for evaluation; transformation zone component PRESENT.   Last mammogram: 08/2022 BIRADS 1.  Last colonoscopy: N/A.      08/27/2022   10:17 AM  Depression screen PHQ 2/9  Decreased Interest 0  Down, Depressed, Hopeless 0  PHQ - 2 Score 0  Altered sleeping 0  Tired, decreased energy 0  Change in appetite 0  Feeling bad or failure about yourself  0  Trouble concentrating 0  Moving slowly or fidgety/restless 0  Suicidal thoughts 0  PHQ-9 Score 0        08/27/2022   10:18 AM  GAD 7 : Generalized Anxiety Score  Nervous, Anxious, on Edge 0  Control/stop worrying 0  Worry too much - different things 0  Trouble relaxing 0  Restless 0  Easily annoyed or irritable 0  Afraid - awful might happen 0  Total GAD 7 Score 0     Review of Systems:    Pertinent items are noted in HPI Denies any headaches, blurred vision, fatigue, shortness of breath, chest pain, abdominal pain, abnormal vaginal discharge/itching/odor/irritation, problems with periods, bowel movements, urination, or intercourse unless otherwise stated above. Pertinent History Reviewed:  Reviewed past medical,surgical, social and family history.  Reviewed problem list, medications and allergies. Physical Assessment:   Vitals:   09/09/23 0851  BP: 135/85  Pulse: 64  Weight: 214 lb (97.1 kg)  Height: 5\' 9"  (1.753 m)  Body mass index is 31.6 kg/m.        Physical Examination:   General appearance - well appearing, and in no distress  Mental status - alert, oriented to person, place, and time  Psych:  She has a normal mood and affect  Skin - warm and dry, normal color, no suspicious lesions noted  Chest - effort normal, all lung fields clear to auscultation bilaterally  Heart - normal rate and regular rhythm  Breasts - breasts appear normal, no suspicious masses, no skin or nipple changes or  axillary nodes  Abdomen - soft, nontender, nondistended, no masses or organomegaly  Pelvic -  VULVA: normal appearing vulva with no masses, tenderness or lesions   VAGINA: normal appearing vagina with normal color and discharge, no lesions   CERVIX: normal appearing cervix without discharge or lesions, no CMT  Thin prep pap  is done with HR HPV cotesting  UTERUS: uterus is felt to be normal size, shape, consistency and nontender   ADNEXA: No adnexal masses or tenderness noted.  Extremities:  No swelling or varicosities noted  Chaperone present for exam  No results found for this or any previous visit (from the past 24 hours).    Assessment & Plan:  1. Well woman exam with routine gynecological exam (Primary) - Cervical cancer screening: Discussed guidelines. Pap with HPV collected - STD Testing: declines - Birth Control: Discussed options and their risks, benefits and  common side effects; discussed VTE with estrogen containing options. Desires: menpause - Breast Health: Encouraged self breast awareness/SBE. Teaching provided. Discussed limits of clinical breast exam for detecting breast cancer. Rx given for MXR - F/U 12 months and prn  - MM 3D SCREENING MAMMOGRAM BILATERAL BREAST; Future - TSH Rfx on Abnormal to Free T4  2. Hot flashes Labs today to rule out alternate causes of amenorrhea including hyperprolactinemia or hypopituitarism. If low estradiol /high FSH, consistent with menopause and consider HRT. Reviewed risks and benefits of HRT ASCVD risk: 5.32% - Prolactin - FSH - Estradiol  - TSH Rfx on Abnormal to Free T4  3. Screening for cervical cancer Pap collected - Cytology - PAP  4. Amenorrhea - Prolactin - FSH - Estradiol  - TSH Rfx on Abnormal to Free T4       No orders of the defined types were placed in this encounter.   Meds: No orders of the defined types were placed in this encounter.   Follow-up: No follow-ups on file.  Kiki Pelton, MD 09/09/2023 9:21 AM

## 2023-09-10 LAB — PROLACTIN: Prolactin: 2.4 ng/mL — ABNORMAL LOW (ref 4.8–33.4)

## 2023-09-10 LAB — ESTRADIOL: Estradiol: 15.6 pg/mL

## 2023-09-10 LAB — TSH RFX ON ABNORMAL TO FREE T4: TSH: 1.71 u[IU]/mL (ref 0.450–4.500)

## 2023-09-10 LAB — FOLLICLE STIMULATING HORMONE: FSH: 1.8 m[IU]/mL

## 2023-09-13 ENCOUNTER — Encounter (HOSPITAL_BASED_OUTPATIENT_CLINIC_OR_DEPARTMENT_OTHER): Payer: Self-pay

## 2023-09-13 ENCOUNTER — Ambulatory Visit (HOSPITAL_BASED_OUTPATIENT_CLINIC_OR_DEPARTMENT_OTHER)
Admission: RE | Admit: 2023-09-13 | Discharge: 2023-09-13 | Disposition: A | Discharge status: Home / Self Care | Source: Ambulatory Visit | Attending: Obstetrics and Gynecology | Admitting: Obstetrics and Gynecology

## 2023-09-13 DIAGNOSIS — Z1231 Encounter for screening mammogram for malignant neoplasm of breast: Secondary | ICD-10-CM | POA: Diagnosis not present

## 2023-09-13 DIAGNOSIS — Z01419 Encounter for gynecological examination (general) (routine) without abnormal findings: Secondary | ICD-10-CM | POA: Diagnosis present

## 2023-09-13 LAB — CYTOLOGY - PAP
Chlamydia: NEGATIVE
Comment: NEGATIVE
Comment: NEGATIVE
Comment: NORMAL
Diagnosis: NEGATIVE
High risk HPV: NEGATIVE
Neisseria Gonorrhea: NEGATIVE

## 2023-09-16 ENCOUNTER — Ambulatory Visit: Payer: Self-pay | Admitting: Obstetrics and Gynecology

## 2023-09-16 DIAGNOSIS — R232 Flushing: Secondary | ICD-10-CM

## 2023-09-16 MED ORDER — COMBIPATCH 0.05-0.14 MG/DAY TD PTTW: 1.0000 | MEDICATED_PATCH | TRANSDERMAL | 12 refills | Status: AC

## 2023-09-19 NOTE — Progress Notes (Signed)
 CC:   Right foot pain   A/P:   1.  Right foot pain: X-ray ordered.  Pending results.  Encouraged to use Tylenol as needed for pain.  Encouraged to use the RICE acronym and to follow-up in 1 week.  Discussed potential need for foot and ankle referral.  Encouraged to make sure that she is wearing supportive tennis shoes. Anticipatory guidance was discussed with the patient in detail and a copy of my suggestions was given to the patient and a copy was placed in the chart.  RTC 1 week as needed.  HPI:   Natasha Figueroa, a 48 y.o. female, presents today with complaints of right foot pain for 1 week.  She does work 8 to 10 hours/day on her feet and has been having right foot pain for about a week.  She denies any injury.  She has been taking ibuprofen with no improvement of pain.  Encouraged her to use Tylenol instead.  Also encouraged her to use the RICE acronym.  Discussed with her that we would do an x-ray today and discussed the potential need for foot and ankle referral.  She is agreeable to plan and denies any other needs or concerns for today's visit.  Current Medications[1]   Allergies: Aspirin and Tramadol  Review of Systems  Constitutional:  Negative for fatigue and fever.  HENT:  Negative for congestion.   Respiratory:  Negative for cough and shortness of breath.   Cardiovascular:  Negative for chest pain.  Gastrointestinal:  Negative for abdominal pain.  Musculoskeletal:  Positive for arthralgias (right foot pain).     VS:    Vitals:   09/19/23 1513  BP: 110/78  BP Location: Left arm  Patient Position: Sitting  Pulse: 76  Temp: 97.6 F (36.4 C)  TempSrc: Temporal  SpO2: 93%  Weight: 96.4 kg (212 lb 8 oz)  Height: 1.71 m (5' 7.32)     Physical Exam Vitals reviewed.  Constitutional:      Appearance: Normal appearance.  HENT:     Nose: Nose normal.     Mouth/Throat:     Mouth: Mucous membranes are moist.   Eyes:     Conjunctiva/sclera: Conjunctivae normal.     Cardiovascular:     Rate and Rhythm: Normal rate.     Pulses: Normal pulses.  Pulmonary:     Effort: Pulmonary effort is normal.   Musculoskeletal:        General: Swelling (right foot) and tenderness (right foot) present. No deformity or signs of injury.     Cervical back: Normal range of motion.       Feet:  Feet:     Comments: Tenderness and localized swelling to area marked.  Skin:    General: Skin is warm and dry.     Capillary Refill: Capillary refill takes less than 2 seconds.   Neurological:     General: No focal deficit present.     Mental Status: She is alert and oriented to person, place, and time.   Psychiatric:        Mood and Affect: Mood normal.     PHQ: 0       [1]  Current Outpatient Medications:  .  amLODIPine (NORVASC) 10 mg tablet, Take 1 tablet (10 mg total) by mouth daily., Disp: 90 tablet, Rfl: 0 .  chlorthalidone (HYGROTON) 25 mg tablet, Take 1 tablet (25 mg total) by mouth daily., Disp: 90 tablet, Rfl: 1 .  cyclobenzaprine (FLEXERIL) 10 mg tablet, Take  1 tablet (10 mg total) by mouth at bedtime for 10 days., Disp: 10 tablet, Rfl: 0 .  lisinopriL (PRINIVIL) 5 mg tablet, Take 1 tablet (5 mg total) by mouth daily., Disp: 90 tablet, Rfl: 1

## 2023-11-02 ENCOUNTER — Ambulatory Visit: Admitting: Obstetrics and Gynecology

## 2023-11-02 VITALS — BP 123/89 | HR 70 | Ht 67.0 in | Wt 206.0 lb

## 2023-11-02 DIAGNOSIS — R232 Flushing: Secondary | ICD-10-CM

## 2023-11-02 DIAGNOSIS — E236 Other disorders of pituitary gland: Secondary | ICD-10-CM

## 2023-11-02 NOTE — Progress Notes (Signed)
 GYNECOLOGY VISIT  Patient name: Natasha Figueroa MRN [id number]  Date of birth: March 25, 1976 Chief Complaint:   Follow-up (Follow up on patch therapy )   History:  Natasha Figueroa is a 48 y.o. [phone] being seen today for follow up of vasomotor symptoms.  Discussed the use of AI scribe software for clinical note transcription with the patient, who gave verbal consent to proceed.  History of Present Illness Natasha Figueroa is a 48 year old female who presents with hot flashes.  She has been experiencing hot flashes, which have been improving since starting hormone therapy. Initially, she was using the hormone patch incorrectly, applying one patch daily instead of the prescribed twice a week.  She mentions feeling 'a little bit hyper' at the beginning of the treatment, but this symptom has since resolved. No other abnormal side effects are reported.  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 Recent lab results showed estrogen levels were low, but not nonexistent, and thyroid function was normal.    Past Medical History:  Diagnosis Date   Arthritis    generalized   Hypertension    on meds    Past Surgical History:  Procedure Laterality Date   BACK SURGERY  2017   lower back   CHOLECYSTECTOMY  2016    The following portions of the patient's history were reviewed and updated as appropriate: allergies, current medications, past family history, past medical history, past social history, past surgical history and problem list.   Health Maintenance:   Last pap     Component Value Date/Time   DIAGPAP  09/09/2023 0938    - Negative for intraepithelial lesion or malignancy (NILM)   DIAGPAP  06/23/2020 1016    - Negative for intraepithelial lesion or malignancy (NILM)   HPVHIGH Negative 09/09/2023 0938   HPVHIGH Negative 06/23/2020 1016   ADEQPAP  09/09/2023 0938    Satisfactory for evaluation; transformation zone component PRESENT.    ADEQPAP  06/23/2020 1016    Satisfactory for evaluation; transformation zone component PRESENT.    Last mammogram: 09/2023 BIRADS 1   Review of Systems:  Pertinent items are noted in HPI. Comprehensive review of systems was otherwise negative.   Objective:  Physical Exam BP 123/89 (BP Location: Left Arm, Patient Position: Sitting, Cuff Size: Normal)   Pulse 70   Ht 5' 7 (1.702 m)   Wt 206 lb (93.4 kg)   BMI 32.26 kg/m    Physical Exam Vitals and nursing note reviewed.  Constitutional:      Appearance: Normal appearance.  HENT:     Head: Normocephalic and atraumatic.  Pulmonary:     Effort: Pulmonary effort is normal.  Skin:    General: Skin is warm and dry.  Neurological:     General: No focal deficit present.     Mental Status: She is alert.  Psychiatric:        Mood and Affect: Mood normal.        Behavior: Behavior normal.        Thought Content: Thought content normal.        Judgment: Judgment normal.      Labs and Imaging  TSH 1.710   Estradiol  15.6   FSH 1.8   Prolactin 2.4 Low       Assessment & Plan:   Assessment & Plan Menopausal symptoms Hot flashes improved with correct hormone patch use. Initial excessive estrogen exposure corrected. Symptoms expected to stabilize  with proper usage. - Instruct to use hormone patch twice a week. - Re-evaluate annually or sooner if symptoms change.  Pituitary hormone evaluation Atypical menopause hormone levels with low estrogen and non-elevated FSH. 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 - Ordered additional pituitary hormone tests.   Routine preventative health maintenance measures emphasized.  Carter Quarry, MD [facility]

## 2023-11-03 LAB — LUTEINIZING HORMONE: LH: 0.7 m[IU]/mL

## 2023-11-03 LAB — ACTH: ACTH: 92.8 pg/mL — ABNORMAL HIGH (ref 7.2–63.3)

## 2023-11-03 LAB — GROWTH HORMONE: Growth Hormone: <0.1 ng/mL (ref 0.0–10.0)

## 2023-11-04 ENCOUNTER — Ambulatory Visit: Payer: Self-pay | Admitting: Obstetrics and Gynecology

## 2023-11-04 DIAGNOSIS — E230 Hypopituitarism: Secondary | ICD-10-CM

## 2023-11-04 DIAGNOSIS — E236 Other disorders of pituitary gland: Secondary | ICD-10-CM

## 2023-11-24 ENCOUNTER — Ambulatory Visit (INDEPENDENT_AMBULATORY_CARE_PROVIDER_SITE_OTHER): Admitting: "Endocrinology

## 2023-11-24 ENCOUNTER — Encounter: Payer: Self-pay | Admitting: "Endocrinology

## 2023-11-24 VITALS — BP 124/84 | HR 68 | Ht 67.0 in | Wt 205.6 lb

## 2023-11-24 DIAGNOSIS — E236 Other disorders of pituitary gland: Secondary | ICD-10-CM

## 2023-11-24 DIAGNOSIS — E230 Hypopituitarism: Secondary | ICD-10-CM | POA: Insufficient documentation

## 2023-11-24 DIAGNOSIS — E349 Endocrine disorder, unspecified: Secondary | ICD-10-CM | POA: Diagnosis not present

## 2023-11-24 NOTE — Progress Notes (Signed)
 Endocrinology Consult Note                                            11/24/2023, 2:58 PM   Subjective:    Patient ID: Natasha Figueroa, female    DOB: 05-17-1975, PCP Lynwood Laymon Pounds, NP   Past Medical History:  Diagnosis Date   Arthritis    generalized   Hypertension    on meds   Past Surgical History:  Procedure Laterality Date   BACK SURGERY  2017   lower back   CHOLECYSTECTOMY  2016   Social History   Socioeconomic History   Marital status: Married    Spouse name: Not on file   Number of children: Not on file   Years of education: Not on file   Highest education level: Not on file  Occupational History   Not on file  Tobacco Use   Smoking status: Never   Smokeless tobacco: Never  Vaping Use   Vaping status: Never Used  Substance and Sexual Activity   Alcohol use: Never   Drug use: Never   Sexual activity: Yes    Birth control/protection: None  Other Topics Concern   Not on file  Social History Narrative   Right handed    Lives with family    Social Drivers of Health   Financial Resource Strain: Low Risk  (04/24/2022)   Received from [facility]   Overall Financial Resource Strain (CARDIA)    Difficulty of Paying Living Expenses: Not hard at all  Food Insecurity: Unknown (07/19/2023)   Received from [facility]   Hunger Vital Sign    Within the past 12 months, you worried that your food would run out before you got money to buy more: Patient declined to answer    Within the past 12 months, the food you bought just didn't last and you didn't have money to get more. : Patient declined to answer  Transportation Needs: Not on file (07/19/2023)  Physical Activity: Unknown (04/24/2022)   Received from [facility]   Exercise Vital Sign    On average, how many days per week do you engage in moderate to strenuous exercise (like a brisk walk)?: Patient declined    On average, how many minutes do you engage in exercise at this level?: 20 min   Stress: Patient Declined (04/24/2022)   Received from [facility] - Occupational Stress Questionnaire    Feeling of Stress : Patient declined  Social Connections: Not on File (12/25/2022)   Received from [facility]   Social Connections    Connectedness: 0   Family History  Problem Relation Age of Onset   Asthma Mother    Hypertension Mother    Stroke Mother    Diabetes Father    Colon cancer Maternal Aunt    Colon polyps Maternal Aunt 49   Esophageal cancer Neg Hx    Rectal cancer Neg Hx    Stomach cancer Neg Hx    Outpatient Encounter Medications as of 11/24/2023  Medication Sig   atenolol-chlorthalidone (TENORETIC) 100-25 MG tablet Take 1 tablet by mouth daily. (Patient not taking: Reported on 11/24/2023)   chlorthalidone (HYGROTON) 25 MG tablet Take 25 mg by mouth daily.   estradiol-norethindrone (COMBIPATCH) 0.05-0.14 MG/DAY Place 1 patch onto the skin 2 (two) times a  week.   lisinopril (ZESTRIL) 5 MG tablet Take 5 mg by mouth daily.   [DISCONTINUED] Ascorbic Acid (VITAMIN C) 100 MG tablet Take 100 mg by mouth daily.   [DISCONTINUED] COLLAGEN PO Take 4 capsules by mouth daily at 6 (six) AM.   [DISCONTINUED] Vitamin D-Vitamin K (VITAMIN K2-VITAMIN D3 PO) Take 1 tablet by mouth daily at 6 (six) AM.   No facility-administered encounter medications on file as of 11/24/2023.   ALLERGIES: Allergies  Allergen Reactions   Aspirin Other (See Comments), Palpitations and Rash    tachycardia  Other Reaction(s): Intolerance - Will Not Trigger Allergy Alert  tachycardia tachycardia    tachycardia   Tramadol Other (See Comments) and Rash    Rash all over body. Rash all over body.     VACCINATION STATUS:  There is no immunization history on file for this patient.  HPI Natasha Figueroa is 48 y.o. female who presents today with a medical history as above. she is being seen in consultation for empty sella requested by Lynwood Laymon Pounds, NP.    She is accompanied by her Spanish language interpreter [place]. History is obtained from the patient as well as her chart review. Patient denies any prior history of pituitary surgery.  Due to clinical complaints of tingling on the right side of face and headaches she underwent the brain MRI in February 2022 which showed incidental finding of empty sella. It does not appear that she had endocrine workup subsequently.  At age 41 in 2016 she has seen cessation of her menstrual periods.  Subsequently she was given hormone replacement therapy-with estradiol-norethindrone combination patch twice a week. -She denies any headaches or visual field deficits.  She is not on any other pituitary/endocrine hormone supplements. More recently she underwent lab work  in July 2025 work which showed low prolactin of 2.4, normal TSH of 1.7, low close hormone of less than 1, low LH of 0.7, high ACTH of 92.8.   She reports fluctuating body weight, more recently has seen intentional weight loss   Patient has 3 grown children between ages of 77 and 66 years and 3 grandchildren.  She is not looking for any more fertility. -She does not have acute complaints today, except that she is dealing with ongoing fatigue.  Review of Systems  Constitutional: +mildly fluctuating body weight, no fatigue, no subjective hyperthermia, no subjective hypothermia Eyes: no blurry vision, no xerophthalmia ENT: no sore throat, no nodules palpated in throat, no dysphagia/odynophagia, no hoarseness Cardiovascular: no Chest Pain, no Shortness of Breath, no palpitations, no leg swelling Respiratory: no cough, no shortness of breath Gastrointestinal: no Nausea/Vomiting/Diarhhea Musculoskeletal: no muscle/joint aches Skin: no rashes Neurological: no tremors, no numbness, no tingling, no dizziness Psychiatric: no depression, no anxiety  Objective:       11/24/2023    8:08 AM 11/02/2023   10:21 AM 09/09/2023    8:51 AM  Vitals with BMI   Height 5' 7 5' 7 5' 9  Weight 205 lbs 10 oz 206 lbs 214 lbs  BMI 32.19 32.26 31.59  Systolic 124 123 864  Diastolic 84 89 85  Pulse 68 70 64    BP 124/84   Pulse 68   Ht 5' 7 (1.702 m)   Wt 205 lb 9.6 oz (93.3 kg)   BMI 32.20 kg/m   Wt Readings from Last 3 Encounters:  11/24/23 205 lb 9.6 oz (93.3 kg)  11/02/23 206 lb (93.4 kg)  09/09/23 214 lb (97.1 kg)  Physical Exam  Constitutional:  Body mass index is 32.2 kg/m.,  not in acute distress, normal state of mind Eyes: PERRLA, EOMI, no exophthalmos ENT: moist mucous membranes, no gross thyromegaly, no gross cervical lymphadenopathy Cardiovascular: normal precordial activity, Regular Rate and Rhythm, no Murmur/Rubs/Gallops Respiratory:  adequate breathing efforts, no gross chest deformity, Clear to auscultation bilaterally Gastrointestinal: abdomen soft, Non -tender, No distension, Bowel Sounds present, no gross organomegaly Musculoskeletal: no gross deformities, strength intact in all four extremities, no peripheral edema Skin: moist, warm, no rashes Neurological: no tremor with outstretched hands, Deep tendon reflexes normal in bilateral lower extremities.  CMP ( most recent) CMP     Component Value Date/Time   NA 138 09/24/2021 1721   K 3.3 (L) 09/24/2021 1721   CL 99 09/24/2021 1721   CO2 28 09/24/2021 1721   GLUCOSE 97 09/24/2021 1721   BUN 9 09/24/2021 1721   CREATININE 0.68 09/24/2021 1721   CALCIUM 9.3 09/24/2021 1721   PROT 7.3 08/09/2021 1628   ALBUMIN 3.8 08/09/2021 1628   AST 21 08/09/2021 1628   ALT 25 08/09/2021 1628   ALKPHOS 64 08/09/2021 1628   BILITOT 0.6 08/09/2021 1628   GFRNONAA >60 09/24/2021 1721      Lipid Panel ( most recent) Lipid Panel     Component Value Date/Time   CHOL 200 06/20/2020 1405   TRIG 93.0 06/20/2020 1405   HDL 69.30 06/20/2020 1405   CHOLHDL 3 06/20/2020 1405   VLDL 18.6 06/20/2020 1405   LDLCALC 112 (H) 06/20/2020 1405      Lab Results  Component Value  Date   TSH 1.710 09/09/2023    MRI of head without contrast on June 08, 2018 IMPRESSION: 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   Empty sella. This is a nonspecific finding that can be seen in the setting of idiopathic intracranial hypertension.   Minimal nonspecific foci of gliosis/demyelination in the cerebral white matter, most likely without clinical significance.  Assessment & Plan:   1. Empty sella syndrome (HCC) (Primary) 2. Hypopituitarism   - Natasha Figueroa  is being seen at a kind request of Lynwood, Laymon Pounds, NP. - I have reviewed her available  records and clinically evaluated the patient. - Based on these reviews, she has documented nonspecific empty sella,  however,  there is not sufficient information to proceed with definitive treatment plan. -She did have some discordant labs including high ACTH, low prolactin, low LH, low growth hormone, however none of these are dynamic tests. - she will need a repeat,  more complete endocrine assessment.  The following labs will be ordered for her: - TSH - T4, free - Lipid panel - Prolactin - Cortisol-am, blood - Insulin-like growth factor - Comprehensive metabolic panel with GFR  If she is found to have low cortisol, she may benefit from ACTH stimulation test.  Sella/pituitary imaging will be deferred/delayed until her endocrine assessment.  She does not have any clinical signs of CNS mass lesions. If she presents with significant endocrine deficit, she will be considered for target organ hormone replacement.  She is already on estrogen/progesterone replacement through her OB/GYN providers.   - I did not initiate any new prescriptions today. - she is advised to maintain close follow up with Lynwood Laymon Pounds, NP for primary care needs.   -Thank you for involving me in the care of this pleasant patient.  Time spent with the patient: 62  minutes spent in  counseling her about  empty sella  syndrome, hypopituitarism and the rest in obtaining information about her symptoms, reviewing her previous labs/studies (including abstractions from other facilities),  evaluations, and treatments,  and developing a plan to confirm diagnosis and long term treatment based on the latest standards of care/guidelines; and documenting her care.  Natasha Figueroa participated in the discussions, expressed understanding, and voiced agreement with the above plans.  All questions were answered to her satisfaction. she is encouraged to contact clinic should she have any questions or concerns prior to her return visit.  Follow up plan: Return in about 10 days (around 12/04/2023) for Fasting Labs  in AM B4 8.   Ranny Earl, MD [facility] [address] [place], [place] [address] Phone: [phone]  Fax: [phone]     11/24/2023, 2:58 PM  This note was partially dictated with voice recognition software. Similar sounding words can be transcribed inadequately or may not  be corrected upon review.

## 2023-11-28 LAB — INSULIN-LIKE GROWTH FACTOR: Insulin-Like GF-1: 40 ng/mL — AB (ref 70–225)

## 2023-11-28 LAB — COMPREHENSIVE METABOLIC PANEL WITH GFR
ALT: 25 IU/L (ref 0–32)
AST: 24 IU/L (ref 0–40)
Albumin: 4.4 g/dL (ref 3.9–4.9)
Alkaline Phosphatase: 87 IU/L (ref 44–121)
BUN/Creatinine Ratio: 18 (ref 9–23)
BUN: 12 mg/dL (ref 6–24)
Bilirubin Total: 0.5 mg/dL (ref 0.0–1.2)
CO2: 26 mmol/L (ref 20–29)
Calcium: 9.8 mg/dL (ref 8.7–10.2)
Chloride: 98 mmol/L (ref 96–106)
Creatinine, Ser: 0.67 mg/dL (ref 0.57–1.00)
Globulin, Total: 2.7 g/dL (ref 1.5–4.5)
Glucose: 97 mg/dL (ref 70–99)
Potassium: 4.5 mmol/L (ref 3.5–5.2)
Sodium: 139 mmol/L (ref 134–144)
Total Protein: 7.1 g/dL (ref 6.0–8.5)
eGFR: 108 mL/min/1.73 (ref >59)

## 2023-11-28 LAB — LIPID PANEL
Chol/HDL Ratio: 2.7 ratio (ref 0.0–4.4)
Cholesterol, Total: 196 mg/dL (ref 100–199)
HDL: 72 mg/dL (ref >39)
LDL Chol Calc (NIH): 114 mg/dL — ABNORMAL HIGH (ref 0–99)
Triglycerides: 53 mg/dL (ref 0–149)
VLDL Cholesterol Cal: 10 mg/dL (ref 5–40)

## 2023-11-28 LAB — PROLACTIN: Prolactin: 3.1 ng/mL — ABNORMAL LOW (ref 4.8–33.4)

## 2023-11-28 LAB — T4, FREE: Free T4: 1.09 ng/dL (ref 0.82–1.77)

## 2023-11-28 LAB — CORTISOL-AM, BLOOD: Cortisol - AM: 9.5 ug/dL (ref 6.2–19.4)

## 2023-11-28 LAB — TSH: TSH: 1.74 u[IU]/mL (ref 0.450–4.500)

## 2023-12-06 ENCOUNTER — Telehealth: Payer: Self-pay

## 2023-12-06 NOTE — Telephone Encounter (Signed)
 Received call from [facility] that the patient was in their office yesterday talking about postmenopausal bleeding that she was having.   Called patient with interpretor #[id number] named Therisa to get her scheduled. Left voicemail on both of her phone numbers to call back and schedule.

## 2023-12-09 ENCOUNTER — Encounter: Payer: Self-pay | Admitting: "Endocrinology

## 2023-12-09 ENCOUNTER — Ambulatory Visit (INDEPENDENT_AMBULATORY_CARE_PROVIDER_SITE_OTHER): Admitting: "Endocrinology

## 2023-12-09 VITALS — BP 128/84 | HR 88 | Ht 67.0 in | Wt 208.0 lb

## 2023-12-09 DIAGNOSIS — E782 Mixed hyperlipidemia: Secondary | ICD-10-CM | POA: Diagnosis not present

## 2023-12-09 DIAGNOSIS — E236 Other disorders of pituitary gland: Secondary | ICD-10-CM | POA: Diagnosis not present

## 2023-12-09 DIAGNOSIS — E230 Hypopituitarism: Secondary | ICD-10-CM

## 2023-12-09 DIAGNOSIS — Z5320 Procedure and treatment not carried out because of patient's decision for unspecified reasons: Secondary | ICD-10-CM | POA: Insufficient documentation

## 2023-12-09 NOTE — Progress Notes (Signed)
 12/09/2023, 10:45 AM  Endocrinology follow-up note   Subjective:    Patient ID: Natasha Figueroa, female    DOB: December 03, 1975, PCP Lynwood Laymon Pounds, NP   Past Medical History:  Diagnosis Date   Arthritis    generalized   Hypertension    on meds   Past Surgical History:  Procedure Laterality Date   BACK SURGERY  2017   lower back   CHOLECYSTECTOMY  2016   Social History   Socioeconomic History   Marital status: Married    Spouse name: Not on file   Number of children: Not on file   Years of education: Not on file   Highest education level: Not on file  Occupational History   Not on file  Tobacco Use   Smoking status: Never   Smokeless tobacco: Never  Vaping Use   Vaping status: Never Used  Substance and Sexual Activity   Alcohol use: Never   Drug use: Never   Sexual activity: Yes    Birth control/protection: None  Other Topics Concern   Not on file  Social History Narrative   Right handed    Lives with family    Social Drivers of Health   Financial Resource Strain: Low Risk  (04/24/2022)   Received from [facility]   Overall Financial Resource Strain (CARDIA)    Difficulty of Paying Living Expenses: Not hard at all  Food Insecurity: Unknown (07/19/2023)   Received from [facility]   Hunger Vital Sign    Within the past 12 months, you worried that your food would run out before you got money to buy more: Patient declined to answer    Within the past 12 months, the food you bought just didn't last and you didn't have money to get more. : Patient declined to answer  Transportation Needs: Not on file (07/19/2023)  Physical Activity: Unknown (04/24/2022)   Received from [facility]   Exercise Vital Sign    On average, how many days per week do you engage in moderate to strenuous exercise (like a brisk walk)?: Patient declined    On average, how many minutes do you engage in exercise at this level?: 20 min   Stress: Patient Declined (04/24/2022)   Received from [facility] - Occupational Stress Questionnaire    Feeling of Stress : Patient declined  Social Connections: Not on File (12/25/2022)   Received from [facility]   Social Connections    Connectedness: 0   Family History  Problem Relation Age of Onset   Asthma Mother    Hypertension Mother    Stroke Mother    Diabetes Father    Colon cancer Maternal Aunt    Colon polyps Maternal Aunt 49   Esophageal cancer Neg Hx    Rectal cancer Neg Hx    Stomach cancer Neg Hx    Outpatient Encounter Medications as of 12/09/2023  Medication Sig   atenolol-chlorthalidone (TENORETIC) 100-25 MG tablet Take 1 tablet by mouth daily. (Patient not taking: Reported on 11/24/2023)   chlorthalidone (HYGROTON) 25 MG tablet Take 25 mg by mouth daily.   estradiol -norethindrone (COMBIPATCH ) 0.05-0.14 MG/DAY Place 1 patch onto the skin 2 (two)  times a week.   lisinopril (ZESTRIL) 5 MG tablet Take 5 mg by mouth daily.   No facility-administered encounter medications on file as of 12/09/2023.   ALLERGIES: Allergies  Allergen Reactions   Aspirin Other (See Comments), Palpitations and Rash    tachycardia  Other Reaction(s): Intolerance - Will Not Trigger Allergy Alert  tachycardia tachycardia    tachycardia   Tramadol Other (See Comments) and Rash    Rash all over body. Rash all over body.     VACCINATION STATUS:  There is no immunization history on file for this patient.  HPI Natasha Figueroa is 48 y.o. female who presents today with a medical history as above. she is being seen in follow-up after she was seen in consultation for empty sella requested by Lynwood Laymon Pounds, NP.   She is accompanied by her Spanish language interpreter Avelina. History is obtained from the patient as well as her chart review. Patient denies any prior history of pituitary surgery.  Due to clinical complaints of tingling on the  right side of face and headaches she underwent the brain MRI in February 2022 which showed incidental finding of empty sella. She was sent to lab for endocrine assessment which showed normal thyroid function test, normal cortisol, suppressed prolactin and IGF-I.  .  - She has no clinical symptoms at this time.  She has been on hormone replacement therapy-with estradiol -norethindrone combination patch twice a week. -She denies any headaches or visual field deficits.  She is not on any other pituitary/endocrine hormone supplements. She reports fluctuating body weight, more recently has seen intentional weight loss . She has hyperlipidemia, not on treatment.  Patient has 3 grown children between ages of 88 and 4 years and 3 grandchildren.  She is not looking for any more fertility. -She does not have acute complaints today, except that she is dealing with ongoing fatigue.  Review of Systems  Constitutional: +mildly fluctuating body weight, no fatigue, no subjective hyperthermia, no subjective hypothermia Eyes: no blurry vision, no xerophthalmia  Objective:       12/09/2023    9:16 AM 11/24/2023    8:08 AM 11/02/2023   10:21 AM  Vitals with BMI  Height 5' 7 5' 7 5' 7  Weight 208 lbs 205 lbs 10 oz 206 lbs  BMI 32.57 32.19 32.26  Systolic 128 124 876  Diastolic 84 84 89  Pulse 88 68 70    BP 128/84   Pulse 88   Ht 5' 7 (1.702 m)   Wt 208 lb (94.3 kg)   BMI 32.58 kg/m   Wt Readings from Last 3 Encounters:  12/09/23 208 lb (94.3 kg)  11/24/23 205 lb 9.6 oz (93.3 kg)  11/02/23 206 lb (93.4 kg)    Physical Exam  Constitutional:  Body mass index is 32.58 kg/m.,  not in acute distress, normal state of mind Eyes: PERRLA, EOMI, no exophthalmos ENT: moist mucous membranes, no gross thyromegaly, no gross cervical lymphadenopathy   Recent Results (from the past 2160 hours)  ACTH      Status: Abnormal   Collection Time: 11/02/23 11:21 AM  Result Value Ref Range   ACTH  92.8 (H)  7.2 - 63.3 pg/mL    Comment: ACTH  reference interval for samples collected between 7 and 10 AM.  LH     Status: None   Collection Time: 11/02/23 11:21 AM  Result Value Ref Range   LH 0.7 mIU/mL    Comment:  Adult Female              Range                       Follicular phase      2.4 -  12.6                       Ovulation phase      14.0 -  95.6                       Luteal phase          1.0 -  11.4                       Postmenopausal        7.7 -  58.5   Growth hormone     Status: None   Collection Time: 11/02/23 11:21 AM  Result Value Ref Range   Growth Hormone <0.1 0.0 - 10.0 ng/mL  TSH     Status: None   Collection Time: 11/25/23  9:15 AM  Result Value Ref Range   TSH 1.740 0.450 - 4.500 uIU/mL  T4, free     Status: None   Collection Time: 11/25/23  9:15 AM  Result Value Ref Range   Free T4 1.09 0.82 - 1.77 ng/dL  Lipid panel     Status: Abnormal   Collection Time: 11/25/23  9:15 AM  Result Value Ref Range   Cholesterol, Total 196 100 - 199 mg/dL   Triglycerides 53 0 - 149 mg/dL   HDL 72 >60 mg/dL   VLDL Cholesterol Cal 10 5 - 40 mg/dL   LDL Chol Calc (NIH) 885 (H) 0 - 99 mg/dL   Chol/HDL Ratio 2.7 0.0 - 4.4 ratio    Comment:                                   T. Chol/HDL Ratio                                             Men  Women                               1/2 Avg.Risk  3.4    3.3                                   Avg.Risk  5.0    4.4                                2X Avg.Risk  9.6    7.1                                3X Avg.Risk 23.4   11.0   Prolactin     Status: Abnormal   Collection Time: 11/25/23  9:15 AM  Result Value Ref Range   Prolactin 3.1 (L) 4.8 - 33.4 ng/mL  Cortisol-am, blood  Status: None   Collection Time: 11/25/23  9:15 AM  Result Value Ref Range   Cortisol - AM 9.5 6.2 - 19.4 ug/dL  Insulin -like growth factor     Status: Abnormal   Collection Time: 11/25/23  9:15 AM  Result Value Ref Range   Insulin -Like GF-1 40  (L) 70 - 225 ng/mL  Comprehensive metabolic panel with GFR     Status: None   Collection Time: 11/25/23  9:15 AM  Result Value Ref Range   Glucose 97 70 - 99 mg/dL   BUN 12 6 - 24 mg/dL   Creatinine, Ser 9.32 0.57 - 1.00 mg/dL   eGFR 891 >40 fO/fpw/8.26   BUN/Creatinine Ratio 18 9 - 23   Sodium 139 134 - 144 mmol/L   Potassium 4.5 3.5 - 5.2 mmol/L   Chloride 98 96 - 106 mmol/L   CO2 26 20 - 29 mmol/L   Calcium 9.8 8.7 - 10.2 mg/dL   Total Protein 7.1 6.0 - 8.5 g/dL   Albumin 4.4 3.9 - 4.9 g/dL   Globulin, Total 2.7 1.5 - 4.5 g/dL   Bilirubin Total 0.5 0.0 - 1.2 mg/dL   Alkaline Phosphatase 87 44 - 121 IU/L   AST 24 0 - 40 IU/L   ALT 25 0 - 32 IU/L       Lab Results  Component Value Date   TSH 1.740 11/25/2023   TSH 1.710 09/09/2023   FREET4 1.09 11/25/2023    MRI of head without contrast on June 08, 2018 IMPRESSION: 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   Empty sella. This is a nonspecific finding that can be seen in the setting of idiopathic intracranial hypertension.   Minimal nonspecific foci of gliosis/demyelination in the cerebral white matter, most likely without clinical significance.  Assessment & Plan:   1. Empty sella syndrome (HCC) (Primary) 2. Hypopituitarism-affecting close hormone and prolactin  - I have reviewed her  new and available  records and clinically evaluated the patient with her Spanish language interpreter Avelina. - Based on these reviews, she has low prolactin and IGF-I indicating partial hypopituitarism, however adequate thyroid, adrenal functions.  She would not need any endocrine intervention at this time.    -She is already on hormone replacement therapy, advised to continue. -She is hesitant to go on statins, whole food plant-based diet was discussed with her. She will need thyroid and adrenal assessment every 6 months. If she is found to have low cortisol, she may benefit from ACTH  stimulation  test.  Sella/pituitary imaging will not be necessary at this time.   She does not have any clinical signs of CNS mass lesions. She is already on estrogen/progesterone replacement through her OB/GYN providers.   - I did not initiate any new prescriptions today. - she is advised to maintain close follow up with Lynwood Laymon Pounds, NP for primary care needs.    I spent  25  minutes in the care of the patient today including review of labs from Thyroid Function, CMP, and other relevant labs ; imaging/biopsy records (current and previous including abstractions from other facilities); face-to-face time discussing  her lab results and symptoms, medications doses, her options of short and long term treatment based on the latest standards of care / guidelines;   and documenting the encounter.  Tashala Cumbo  participated in the discussions, expressed understanding, and voiced agreement with the above plans.  All questions were answered to her satisfaction. she is encouraged to contact  clinic should she have any questions or concerns prior to her return visit.   Follow up plan: Return in about 6 months (around 06/09/2024) for F/U with Pre-visit Labs.   Ranny Earl, MD [facility] [address] [place], [place] [address] Phone: [phone]  Fax: [phone]     12/09/2023, 10:45 AM  This note was partially dictated with voice recognition software. Similar sounding words can be transcribed inadequately or may not  be corrected upon review.

## 2023-12-15 ENCOUNTER — Ambulatory Visit: Admitting: Obstetrics and Gynecology

## 2023-12-29 ENCOUNTER — Ambulatory Visit (INDEPENDENT_AMBULATORY_CARE_PROVIDER_SITE_OTHER): Admitting: Obstetrics & Gynecology

## 2023-12-29 VITALS — BP 124/67 | HR 58 | Ht 67.0 in | Wt 207.0 lb

## 2023-12-29 DIAGNOSIS — N921 Excessive and frequent menstruation with irregular cycle: Secondary | ICD-10-CM | POA: Diagnosis not present

## 2023-12-29 NOTE — Progress Notes (Signed)
 Patient ID: Natasha Figueroa, female   DOB: November 25, 1975, 48 y.o.   MRN: [id number]  Chief Complaint  Patient presents with   Vaginal Bleeding    Bleeding last month    HPI Natasha Figueroa is a 48 y.o. female.  [id number] Patient had not had any vaginal bleeding for several years.  She is currently using estrogen progesterone patch.  She had a few days of light bleeding about a month ago.  FSH was last done in May and was normal HPI  Past Medical History:  Diagnosis Date   Arthritis    generalized   Hypertension    on meds    Past Surgical History:  Procedure Laterality Date   BACK SURGERY  2017   lower back   CHOLECYSTECTOMY  2016    Family History  Problem Relation Age of Onset   Asthma Mother    Hypertension Mother    Stroke Mother    Diabetes Father    Colon cancer Maternal Aunt    Colon polyps Maternal Aunt 56   Esophageal cancer Neg Hx    Rectal cancer Neg Hx    Stomach cancer Neg Hx     Social History Social History   Tobacco Use   Smoking status: Never   Smokeless tobacco: Never  Vaping Use   Vaping status: Never Used  Substance Use Topics   Alcohol use: Never   Drug use: Never    Allergies  Allergen Reactions   Aspirin Other (See Comments), Palpitations and Rash    tachycardia  Other Reaction(s): Intolerance - Will Not Trigger Allergy Alert  tachycardia tachycardia    tachycardia   Tramadol Other (See Comments) and Rash    Rash all over body. Rash all over body.     Current Outpatient Medications  Medication Sig Dispense Refill   chlorthalidone (HYGROTON) 25 MG tablet Take 25 mg by mouth daily.     estradiol -norethindrone (COMBIPATCH ) 0.05-0.14 MG/DAY Place 1 patch onto the skin 2 (two) times a week. 8 patch 12   lisinopril (ZESTRIL) 5 MG tablet Take 5 mg by mouth daily.     atenolol-chlorthalidone (TENORETIC) 100-25 MG tablet Take 1 tablet by mouth daily. (Patient not taking: Reported on 11/24/2023)     No current facility-administered medications  for this visit.    Review of Systems Review of Systems  Constitutional: Negative.   Respiratory: Negative.    Cardiovascular: Negative.   Gastrointestinal: Negative.   Genitourinary:  Negative for pelvic pain, vaginal bleeding and vaginal discharge.    Blood pressure 124/67, pulse (!) 58, height 5' 7 (1.702 m), weight 207 lb (93.9 kg).  Physical Exam Physical Exam Vitals and nursing note reviewed.  Constitutional:      Appearance: Normal appearance.  Cardiovascular:     Rate and Rhythm: Normal rate.  Pulmonary:     Effort: Pulmonary effort is normal.  Neurological:     Mental Status: She is alert.  Psychiatric:        Mood and Affect: Mood normal.        Behavior: Behavior normal.     Data Reviewed FSH and estrogen level  Assessment Perimenopausal with BTB on her current HRT   Plan Orders Placed This Encounter  Procedures   US  PELVIC COMPLETE WITH TRANSVAGINAL    Standing Status:   Future    Expected Date:   01/05/2024    Expiration Date:   03/29/2024    Reason for Exam (SYMPTOM  OR DIAGNOSIS REQUIRED):   BTB  on HRT    Preferred imaging location?:   [facility]   RTC in 6 weeks    Lynwood Solomons 12/29/2023, 10:19 AM

## 2023-12-31 ENCOUNTER — Ambulatory Visit (HOSPITAL_BASED_OUTPATIENT_CLINIC_OR_DEPARTMENT_OTHER)
Admission: RE | Admit: 2023-12-31 | Discharge: 2023-12-31 | Disposition: A | Discharge status: Home / Self Care | Source: Ambulatory Visit | Attending: Obstetrics & Gynecology | Admitting: Obstetrics & Gynecology

## 2023-12-31 DIAGNOSIS — N921 Excessive and frequent menstruation with irregular cycle: Secondary | ICD-10-CM | POA: Insufficient documentation

## 2024-01-09 ENCOUNTER — Ambulatory Visit: Admitting: Obstetrics & Gynecology

## 2024-01-09 ENCOUNTER — Encounter: Payer: Self-pay | Admitting: Obstetrics & Gynecology

## 2024-01-09 VITALS — BP 125/82 | HR 67 | Wt 208.0 lb

## 2024-01-09 DIAGNOSIS — N924 Excessive bleeding in the premenopausal period: Secondary | ICD-10-CM

## 2024-01-09 NOTE — Progress Notes (Signed)
 Patient is Spanish-speaking only, interpreter [facility] interpreter Mariam) present for this encounter.  GYNECOLOGY OFFICE VISIT NOTE  History:  Natasha Figueroa is a 48 y.o. [phone] here today for follow up after having ultrasound for evaluation of one episode of bleeding after a long period of amenorrhea. 09/09/23 FSH was normal, but patient has been using estrogen progesterone patch for a while. She had a few days of light bleeding in August 2025, nothing since then.  She denies any abnormal vaginal discharge, bleeding, pelvic pain or other concerns.  Past Medical History:  Diagnosis Date   Arthritis    generalized   Hypertension    on meds    Past Surgical History:  Procedure Laterality Date   BACK SURGERY  2017   lower back   CHOLECYSTECTOMY  2016    The following portions of the patient's history were reviewed and updated as appropriate: allergies, current medications, past family history, past medical history, past social history, past surgical history and problem list.   Health Maintenance:  Normal pap and negative HRHPV on 09/09/23.  Normal mammogram on 09/13/2023.   Review of Systems:  Pertinent items noted in HPI and remainder of comprehensive ROS otherwise negative.  Physical Exam:  BP 125/82 (BP Location: Left Arm, Patient Position: Sitting, Cuff Size: Large)   Pulse 67   Wt 208 lb (94.3 kg)   BMI 32.58 kg/m  CONSTITUTIONAL: Well-developed, well-nourished female in no acute distress.  SKIN: No rash noted. Not diaphoretic. No erythema. No pallor. MUSCULOSKELETAL: Normal range of motion. No edema noted. NEUROLOGIC: Alert and oriented to person, place, and time. Normal muscle tone coordination. No cranial nerve deficit noted on observation. PSYCHIATRIC: Normal mood and affect. Normal behavior. Normal judgment and thought content. CARDIOVASCULAR: Normal heart rate noted RESPIRATORY: Effort and breath sounds normal, no problems with respiration noted ABDOMEN: No masses or  other overt distention noted on observation. No tenderness.   PELVIC: Deferred  Labs and Imaging US  PELVIC COMPLETE WITH TRANSVAGINAL Result Date: 01/05/2024 EXAM: US  Pelvis, Complete Transvaginal and Transabdominal without Doppler TECHNIQUE: Transabdominal and transvaginal pelvic duplex ultrasound using B-mode/gray scaled imaging without Doppler spectral analysis and color flow was obtained. COMPARISON: None provided CLINICAL HISTORY: Breakthrough bleeding on hormone replacement therapy FINDINGS: UTERUS: The uterus is anteverted. The cervix is unremarkable. A single solid hypoechoic submucosal mass is seen within the posterior body, best seen on image 60, compatible with a submucosal uterine fibroid measuring 7x7x8 mm. The uterus measures 7.0 x 3.7 x 4.5 cm with a volume of 61 cc. ENDOMETRIAL STRIPE: Endometrial stripe is uniform measuring 6 mm in thickness. RIGHT OVARY: No right adnexal or ovarian mass. The right ovary measures 2.3 x 1.3 x 1.6 cm with a volume of 2 cc. LEFT OVARY: No left adnexal or ovarian mass. The left ovary measures 2.4 x 1.3 x 1.8 cm with a volume of 3 cc. FREE FLUID: No free fluid. IMPRESSION: 1. Single solid hypoechoic submucosal mass within the posterior body of the uterus, compatible with a submucosal uterine fibroid, measuring 7 x 7 x 8 mm. Electronically signed by: Dorethia Molt MD 01/05/2024 11:05 PM EDT RP Workstation: [id number]    Assessment and Plan:    1. Perimenopausal bleeding (Primary) Reviewed ultrasound results with patient; discussed thin endometrial stripe and 8 mm submucosal fibroid.  Offered further evaluation with endometrial sampling, but patient declined this.  She was told endometrial sampling is the only way to evaluate for abnormal cells, she still declined this.  Information about procedure  was given to her.  Emphasized that this would be recommended if bleeding recurs.  For now, will continue to monitor closely.  Routine preventative health maintenance  measures emphasized.  Return for any gynecologic concerns.    I spent 35 minutes dedicated to the care of this patient including pre-visit review of records, face to face time with the patient discussing her conditions and treatments, post visit ordering of medications and appropriate tests or procedures, coordinating care and documenting this visit encounter.    GLORIS HUGGER, MD, FACOG Obstetrician & Gynecologist, [facility] for [employer], [facility]

## 2024-04-02 IMAGING — MG MM DIGITAL SCREENING BILAT W/ TOMO AND CAD
8 series · 8 of 24 positions shown · non-contrast
Comparison: Previous exam(s).

CLINICAL DATA: Screening.

EXAM:
DIGITAL SCREENING BILATERAL MAMMOGRAM WITH TOMOSYNTHESIS AND CAD
TECHNIQUE: Bilateral screening digital craniocaudal and mediolateral oblique
mammograms were obtained. Bilateral screening digital breast
tomosynthesis was performed. The images were evaluated with
computer-aided detection.

[R MLO synth-2D]
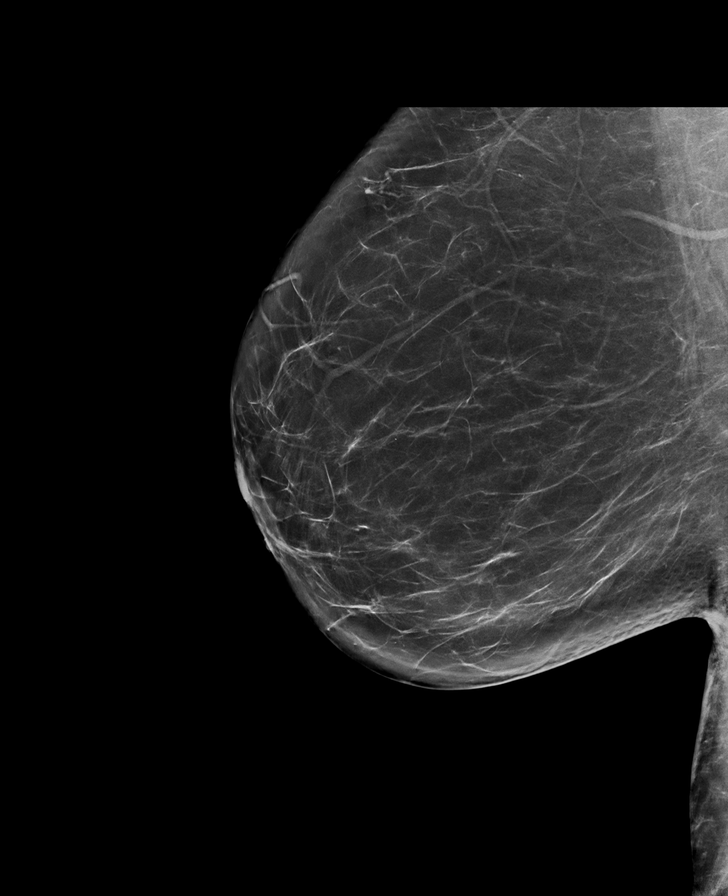

[L CC synth-2D]
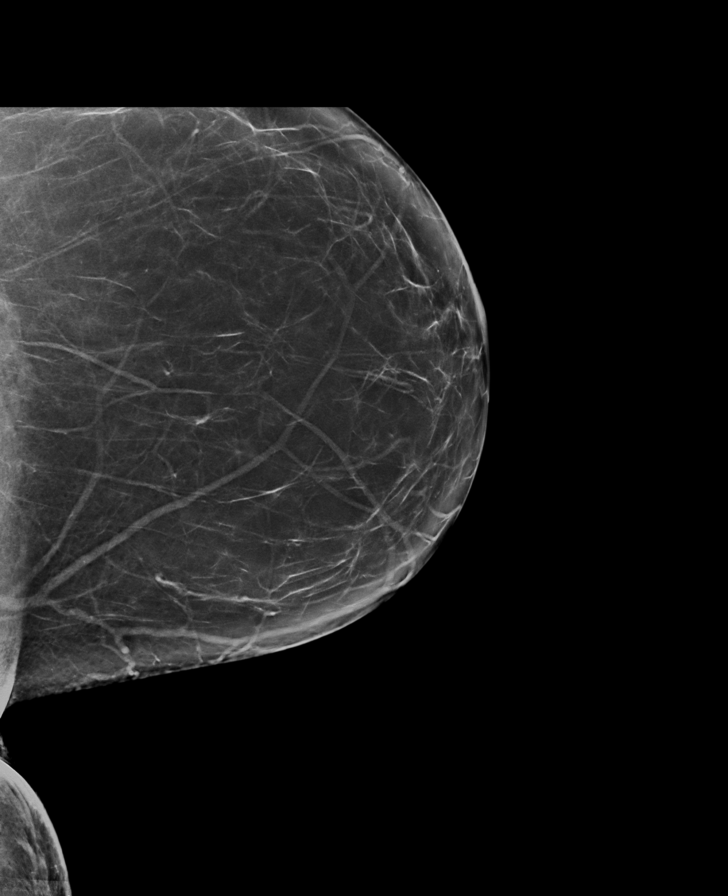

[R CC synth-2D]
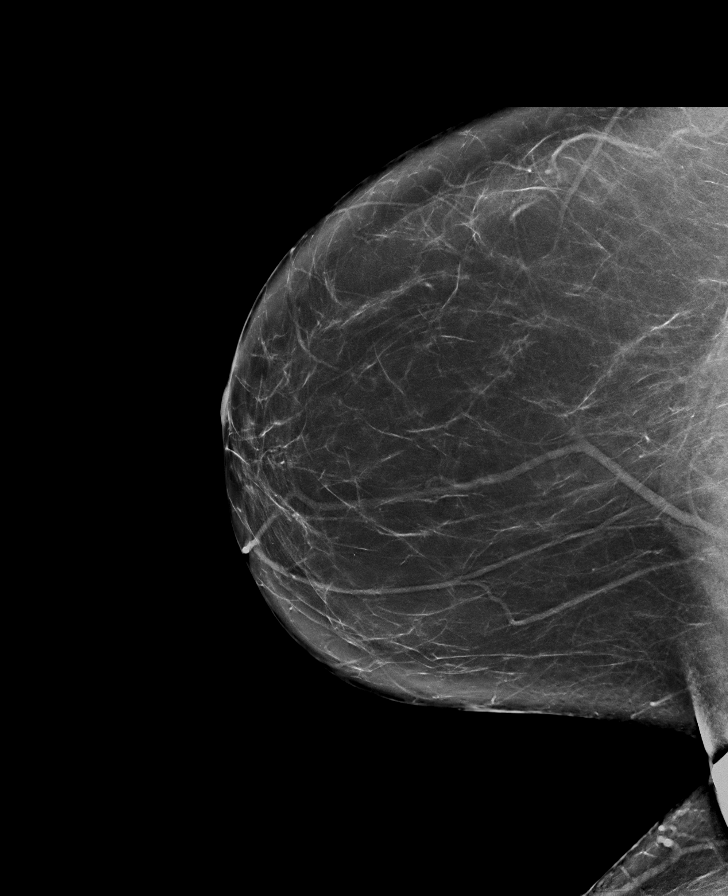

[L MLO synth-2D]
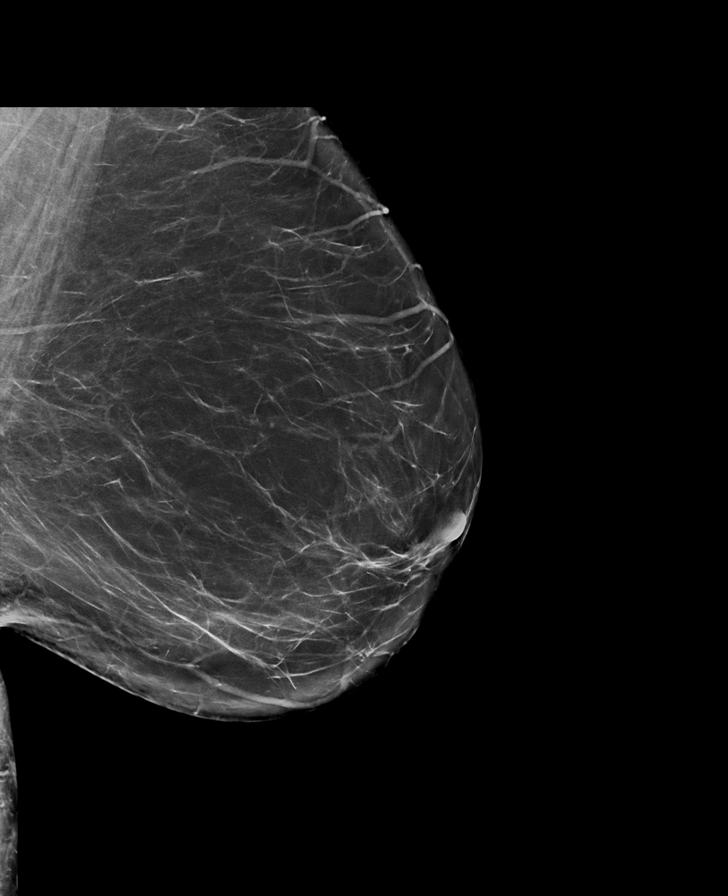

[R CC tomo · tomo slice 41/81.0]
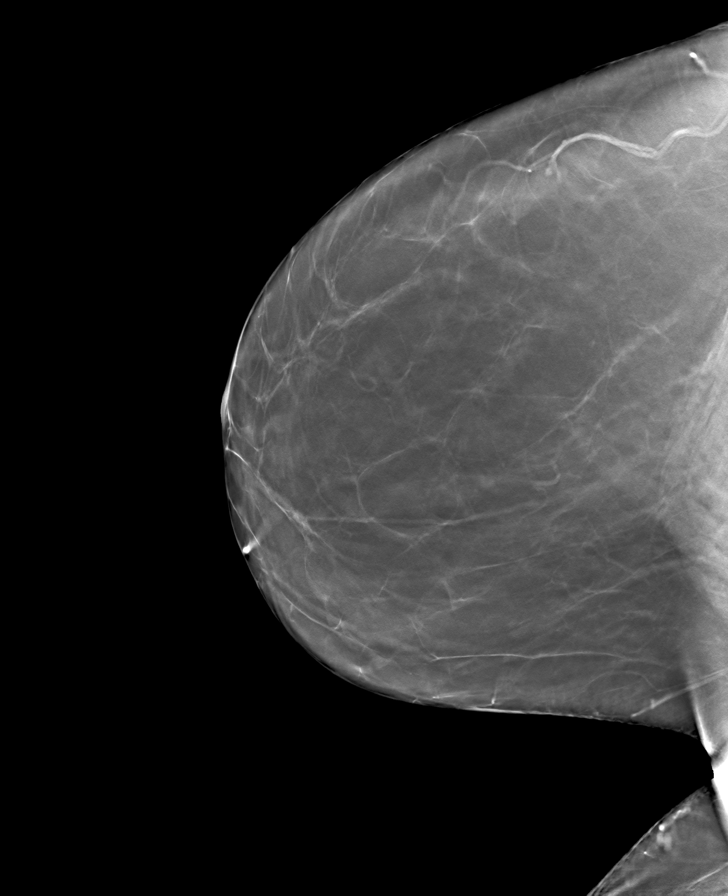

[L CC tomo · tomo slice 41/80.0]
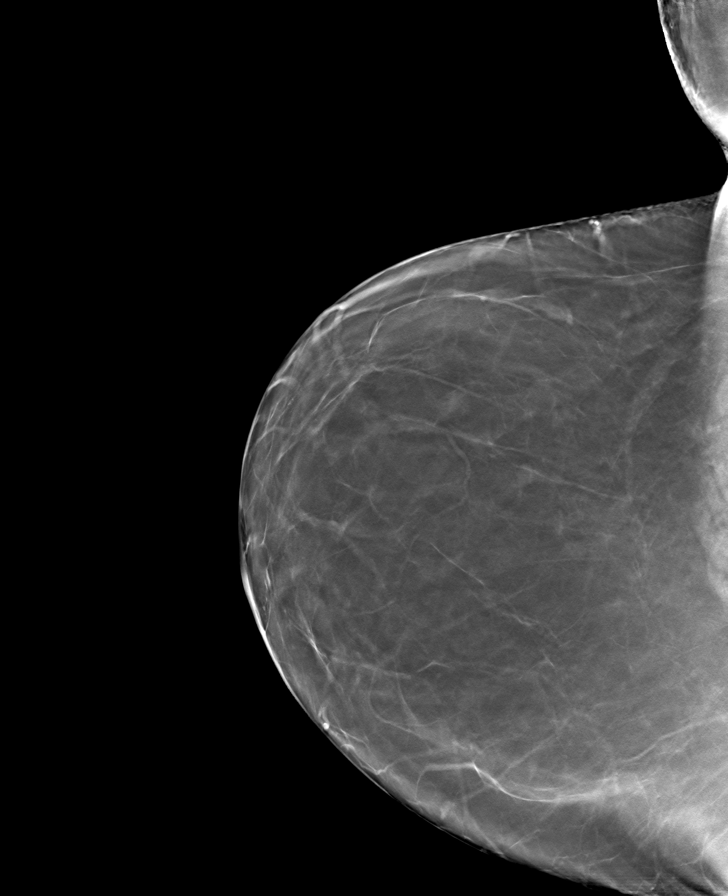

[L MLO tomo · tomo slice 43/84.0]
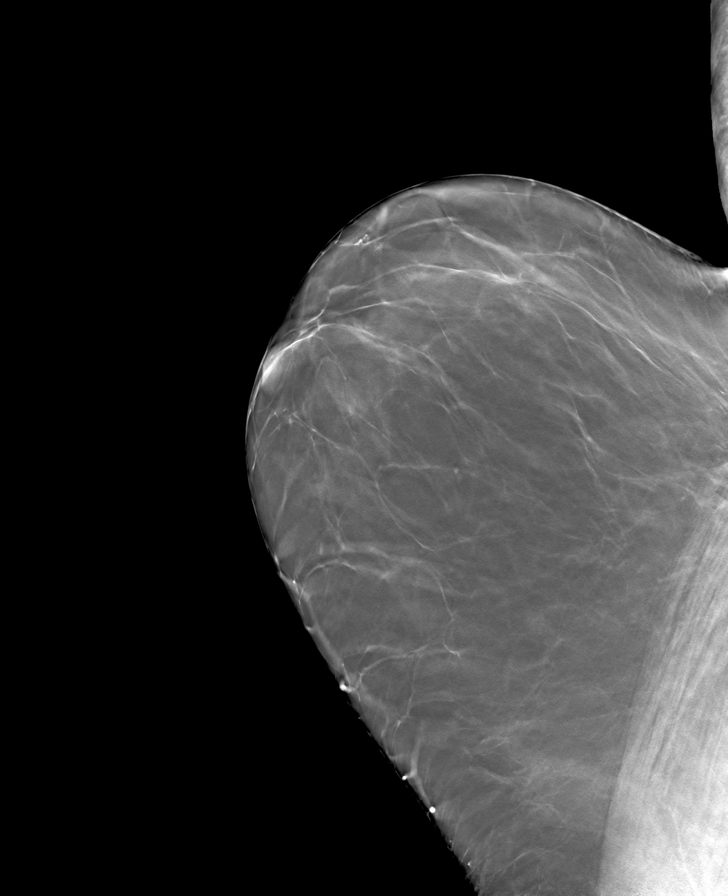

[R MLO tomo · tomo slice 41/82.0]
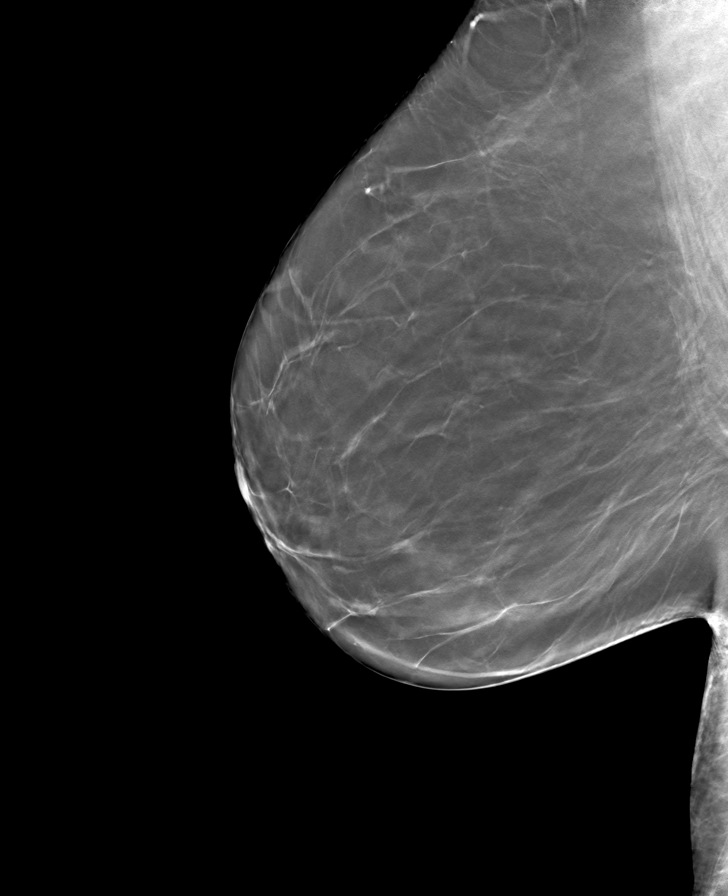

[8 of 24 positions shown; findings below may reference images not displayed]

ACR Breast Density Category b: There are scattered areas of
fibroglandular density.
FINDINGS: There are no findings suspicious for malignancy.
IMPRESSION: No mammographic evidence of malignancy. A result letter of this
screening mammogram will be mailed directly to the patient.

RECOMMENDATION:
Screening mammogram in one year. (Code:51-O-LD2)

BI-RADS CATEGORY  1: Negative.

## 2024-06-12 ENCOUNTER — Ambulatory Visit: Admitting: "Endocrinology
